# Patient Record
Sex: Female | Born: 1940
Health system: Southern US, Community
[De-identification: ages and names within clinical notes are randomized; demographics above are authoritative.]

## PROBLEM LIST (undated history)

## (undated) DIAGNOSIS — F419 Anxiety disorder, unspecified: Secondary | ICD-10-CM

## (undated) DIAGNOSIS — I10 Essential (primary) hypertension: Secondary | ICD-10-CM

## (undated) DIAGNOSIS — F0390 Unspecified dementia without behavioral disturbance: Secondary | ICD-10-CM

## (undated) DIAGNOSIS — S12090A Other displaced fracture of first cervical vertebra, initial encounter for closed fracture: Secondary | ICD-10-CM

## (undated) DIAGNOSIS — J449 Chronic obstructive pulmonary disease, unspecified: Secondary | ICD-10-CM

## (undated) HISTORY — DX: Chronic obstructive pulmonary disease, unspecified: J44.9

## (undated) HISTORY — PX: OTHER SURGICAL HISTORY: SHX169

## (undated) HISTORY — DX: Essential (primary) hypertension: I10

## (undated) HISTORY — DX: Unspecified dementia, unspecified severity, without behavioral disturbance, psychotic disturbance, mood disturbance, and anxiety: F03.90

## (undated) HISTORY — PX: WRIST SURGERY: SHX841

## (undated) HISTORY — DX: Other displaced fracture of first cervical vertebra, initial encounter for closed fracture: S12.090A

---

## 2011-09-21 ENCOUNTER — Emergency Department (HOSPITAL_COMMUNITY)
Admission: EM | Admit: 2011-09-21 | Discharge: 2011-09-21 | Disposition: A | Discharge status: Home / Self Care | Payer: Medicare Other | Attending: Emergency Medicine | Admitting: Emergency Medicine

## 2011-09-21 ENCOUNTER — Encounter (HOSPITAL_COMMUNITY): Payer: Self-pay

## 2011-09-21 ENCOUNTER — Emergency Department (HOSPITAL_COMMUNITY): Payer: Medicare Other

## 2011-09-21 DIAGNOSIS — S52502A Unspecified fracture of the lower end of left radius, initial encounter for closed fracture: Secondary | ICD-10-CM

## 2011-09-21 DIAGNOSIS — IMO0002 Reserved for concepts with insufficient information to code with codable children: Secondary | ICD-10-CM | POA: Insufficient documentation

## 2011-09-21 DIAGNOSIS — S52609A Unspecified fracture of lower end of unspecified ulna, initial encounter for closed fracture: Secondary | ICD-10-CM | POA: Insufficient documentation

## 2011-09-21 DIAGNOSIS — M25539 Pain in unspecified wrist: Secondary | ICD-10-CM | POA: Insufficient documentation

## 2011-09-21 DIAGNOSIS — R55 Syncope and collapse: Secondary | ICD-10-CM | POA: Insufficient documentation

## 2011-09-21 DIAGNOSIS — S52509A Unspecified fracture of the lower end of unspecified radius, initial encounter for closed fracture: Secondary | ICD-10-CM | POA: Insufficient documentation

## 2011-09-21 DIAGNOSIS — F411 Generalized anxiety disorder: Secondary | ICD-10-CM | POA: Insufficient documentation

## 2011-09-21 DIAGNOSIS — W1789XA Other fall from one level to another, initial encounter: Secondary | ICD-10-CM | POA: Insufficient documentation

## 2011-09-21 DIAGNOSIS — S52613A Displaced fracture of unspecified ulna styloid process, initial encounter for closed fracture: Secondary | ICD-10-CM

## 2011-09-21 HISTORY — DX: Anxiety disorder, unspecified: F41.9

## 2011-09-21 LAB — TROPONIN I: Troponin I: <0.3 ng/mL (ref <0.30)

## 2011-09-21 LAB — CBC
HCT: 42.1 % (ref 36.0–46.0)
MCHC: 34.9 g/dL (ref 30.0–36.0)
Platelets: 237 10*3/uL (ref 150–400)
RDW: 13.1 % (ref 11.5–15.5)
WBC: 13.4 10*3/uL — ABNORMAL HIGH (ref 4.0–10.5)

## 2011-09-21 LAB — BASIC METABOLIC PANEL
BUN: 15 mg/dL (ref 6–23)
Chloride: 96 mEq/L (ref 96–112)
Creatinine, Ser: 0.58 mg/dL (ref 0.50–1.10)
GFR calc Af Amer: >90 mL/min (ref >90)
GFR calc non Af Amer: >90 mL/min (ref >90)
Potassium: 3.5 mEq/L (ref 3.5–5.1)

## 2011-09-21 IMAGING — CR DG WRIST COMPLETE 3+V*L*
3 series · 3 of 3 positions shown · non-contrast
Comparison: Plain films [DATE]

CLINICAL DATA: Post reduction left wrist

LEFT WRIST - COMPLETE 3+ VIEW

[x wrist pa left]
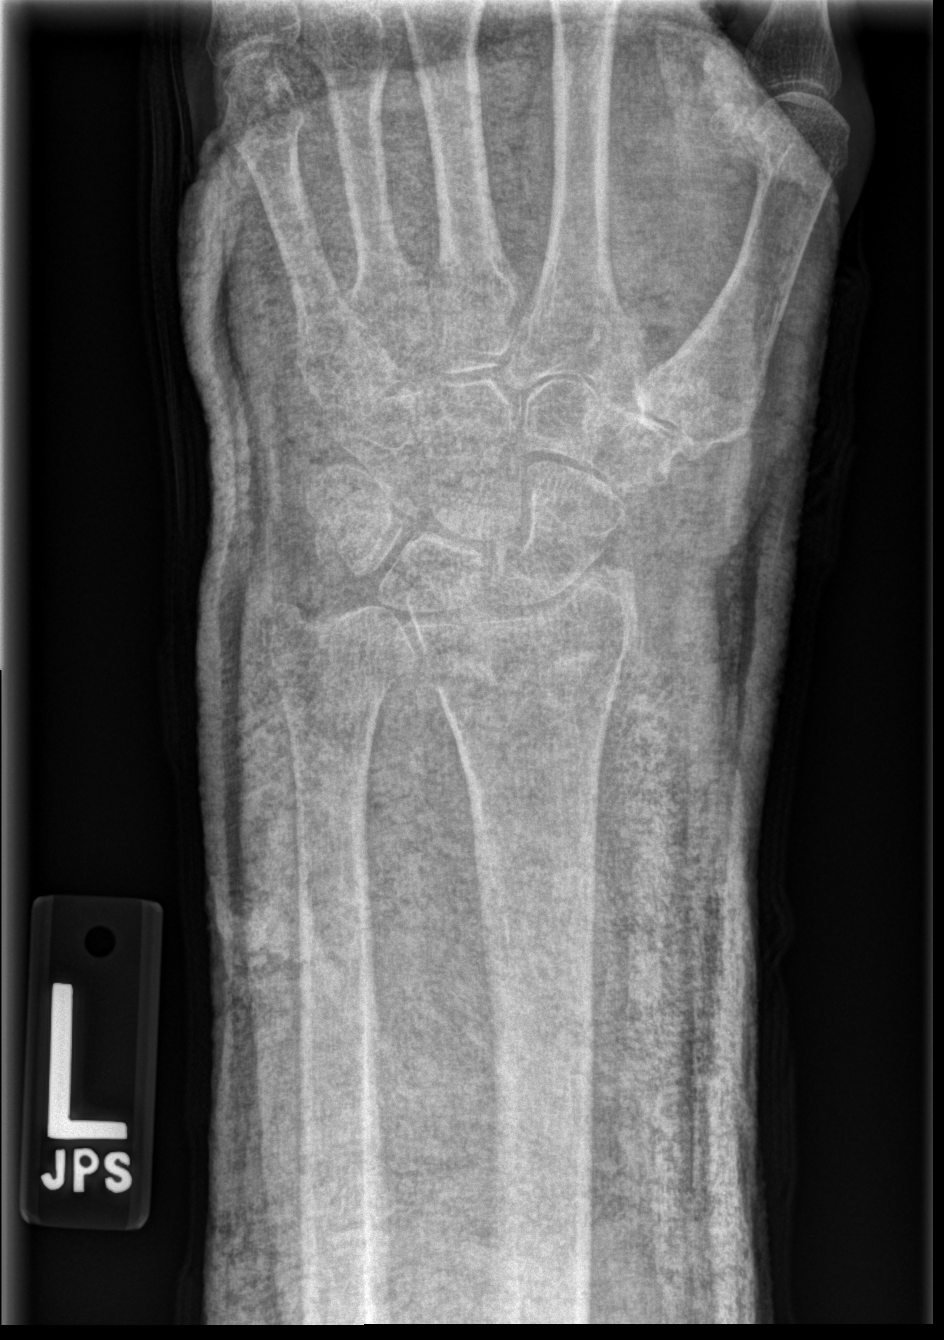

[x wrist obl left]
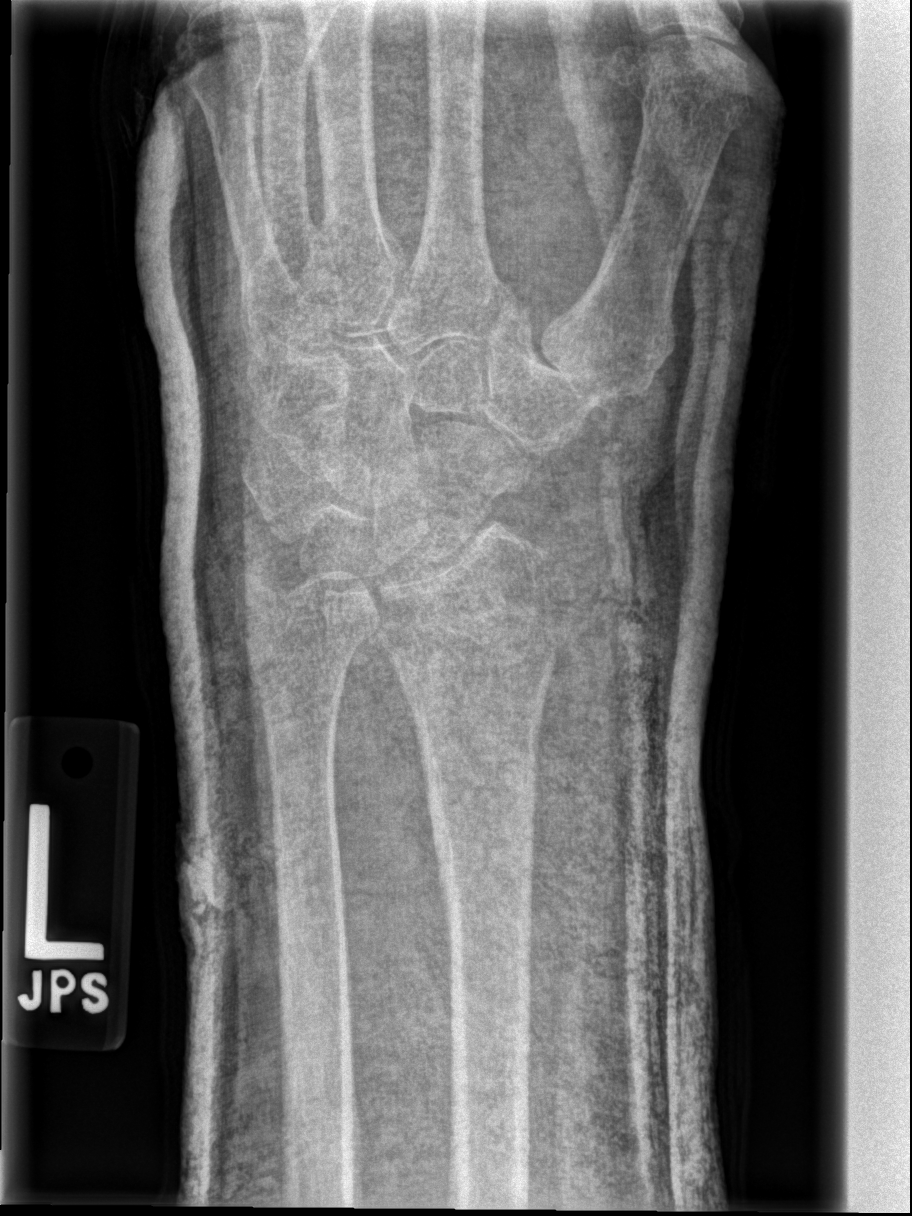

[x wrist lat left]
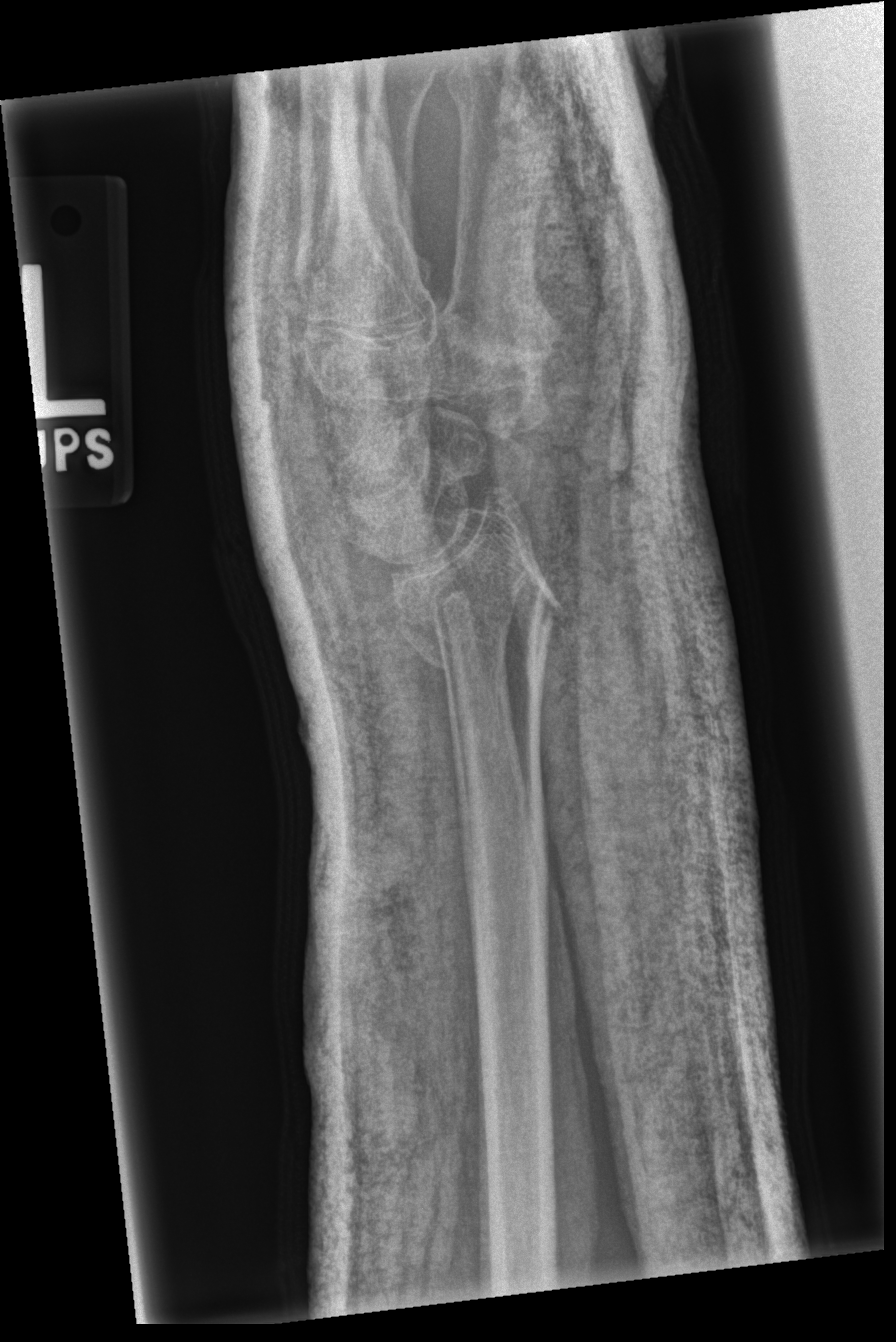

[3 of 3 positions shown; findings below may reference images not displayed]

FINDINGS: Interval reduction of distal left radius and ulnar
fracture.  Radiocarpal joint is intact.  Fine detail is obscured by
the overlying cast material.
IMPRESSION: Post reduction of distal left radius and ulna fracture.

## 2011-09-21 MED ORDER — SODIUM CHLORIDE 0.9 % IV BOLUS (SEPSIS)
500.0000 mL | INTRAVENOUS | Status: AC
  Administered 2011-09-21: 500 mL via INTRAVENOUS

## 2011-09-21 MED ORDER — LIDOCAINE HCL (PF) 1 % IJ SOLN
5.0000 mL | Freq: Once | INTRAMUSCULAR | Status: DC
  Filled 2011-09-21: qty 5

## 2011-09-21 MED ORDER — ONDANSETRON HCL 4 MG/2ML IJ SOLN
4.0000 mg | Freq: Once | INTRAMUSCULAR | Status: AC
  Administered 2011-09-21: 4 mg via INTRAVENOUS
  Filled 2011-09-21: qty 2

## 2011-09-21 MED ORDER — HYDROCODONE-ACETAMINOPHEN 5-325 MG PO TABS: 1.0000 | ORAL_TABLET | ORAL | Status: DC | PRN

## 2011-09-21 NOTE — Progress Notes (Signed)
Orthopedic Tech Progress Note Patient Details:  Amanda Juarez [birth year] [id number]  Ortho Devices Type of Ortho Device: Arm foam sling;Sugartong splint Ortho Device/Splint Location: finger traps Ortho Device/Splint Interventions: Application   Cammer, Mickie Bail 09/21/2011, 12:10 PM

## 2011-09-21 NOTE — ED Provider Notes (Signed)
History     CSN: [id number]  Arrival date & time 09/21/11  8657   First MD Initiated Contact with Patient 09/21/11 [phone]      Chief Complaint  Patient presents with  . Fall    (Consider location/radiation/quality/duration/timing/severity/associated sxs/prior treatment) Patient is a 71 y.o. female presenting with fall. The history is provided by the patient.  Fall The accident occurred 1 to 2 hours ago. The fall occurred while walking. She fell from a height of 3 to 5 ft. She landed on dirt. There was no blood loss. The point of impact was the left wrist. The pain is present in the left wrist. The pain is moderate. She was ambulatory at the scene. There was no entrapment after the fall. There was no alcohol use involved in the accident. Associated symptoms include loss of consciousness (possible, in car after fall while waiting for EMS- having pain and felt hot). Pertinent negatives include no visual change, no fever, no numbness, no abdominal pain, no bowel incontinence, no nausea, no vomiting, no headaches and no tingling. The symptoms are aggravated by use of the injured limb. She has tried immobilization (narcotics) for the symptoms. The treatment provided moderate relief.    Past Medical History  Diagnosis Date  . Anxiety     History reviewed. No pertinent past surgical history.  No family history on file.  History  Substance Use Topics  . Smoking status: Never Smoker   . Smokeless tobacco: Not on file  . Alcohol Use: No    OB History    Grav Para Term Preterm Abortions TAB SAB Ect Mult Living                  Review of Systems  Constitutional: Negative for fever and fatigue.  HENT: Negative for neck pain and neck stiffness.   Eyes: Negative for visual disturbance.  Respiratory: Negative for shortness of breath.   Cardiovascular: Negative for chest pain.  Gastrointestinal: Negative for nausea, vomiting, abdominal pain and bowel incontinence.  Musculoskeletal:  Negative for back pain and gait problem.  Skin: Positive for wound. Negative for pallor and rash.  Neurological: Positive for loss of consciousness (possible, in car after fall while waiting for EMS- having pain and felt hot). Negative for tingling, weakness, light-headedness, numbness and headaches.  Hematological: Does not bruise/bleed easily.  Psychiatric/Behavioral: Negative for confusion.  All other systems reviewed and are negative.    Allergies  Review of patient's allergies indicates no known allergies.  Home Medications  No current outpatient prescriptions on file.  BP 162/90  Pulse 81  Temp 98.2 F (36.8 C) (Oral)  Resp 15  SpO2 98%  Physical Exam  Nursing note and vitals reviewed. Constitutional: She is oriented to person, place, and time. She appears well-developed and well-nourished.  HENT:  Head: Normocephalic and atraumatic.  Eyes: Pupils are equal, round, and reactive to light.  Neck: Full passive range of motion without pain. No spinous process tenderness present.  Cardiovascular: Normal rate, regular rhythm, normal heart sounds and intact distal pulses.   Pulmonary/Chest: Effort normal and breath sounds normal. No respiratory distress. She exhibits no tenderness.  Abdominal: Soft. She exhibits no distension. There is no tenderness.  Musculoskeletal:       Left wrist: She exhibits tenderness and deformity.       Arms: Neurological: She is alert and oriented to person, place, and time. No sensory deficit. GCS eye subscore is 4. GCS verbal subscore is 5. GCS motor subscore is  6.       NVI distal to injury  Skin: Skin is warm and dry. Abrasion noted.     Psychiatric: She has a normal mood and affect.    ED Course  Reduction of fracture Performed by: Theotis Burrow Authorized by: Derwood Kaplan Consent: Verbal consent obtained. Risks and benefits: risks, benefits and alternatives were discussed Consent given by: patient Patient understanding:  patient states understanding of the procedure being performed Patient consent: the patient's understanding of the procedure matches consent given Procedure consent: procedure consent matches procedure scheduled Imaging studies: imaging studies available Required items: required blood products, implants, devices, and special equipment available Patient identity confirmed: verbally with patient Time out: Immediately prior to procedure a "time out" was called to verify the correct patient, procedure, equipment, support staff and site/side marked as required. Preparation: Patient was prepped and draped in the usual sterile fashion. Local anesthesia used: yes Anesthesia: hematoma block Local anesthetic: lidocaine 1% without epinephrine Anesthetic total: 4 ml Patient sedated: no Patient tolerance: Patient tolerated the procedure well with no immediate complications. Comments: Patient placed in finger traps with 10 lbs weight hung from strap across upper arm. Left for ~30 minutes with significant improvement of alignment.   (including critical care time)  Labs Reviewed  CBC - Abnormal; Notable for the following:    WBC 13.4 (*)     All other components within normal limits  BASIC METABOLIC PANEL - Abnormal; Notable for the following:    Sodium 134 (*)     Glucose, Bld 165 (*)     All other components within normal limits  TROPONIN I   Dg Chest 2 View  09/21/2011  *RADIOLOGY REPORT*  Clinical Data: Syncope  CHEST - 2 VIEW  Comparison: None   Findings: Normal cardiac silhouette.  Lungs are hyperinflated.  No effusion, infiltrate, or pneumothorax.  IMPRESSION: No acute cardiopulmonary process.  Original Report Authenticated By: Genevive Bi, M.D.   Dg Wrist Complete Left  09/21/2011  *RADIOLOGY REPORT*  Clinical Data: Post reduction left wrist  LEFT WRIST - COMPLETE 3+ VIEW  Comparison: Plain films 09/21/2011  Findings: Interval reduction of distal left radius and ulnar fracture.   Radiocarpal joint is intact.  Fine detail is obscured by the overlying cast material.  IMPRESSION:  Post reduction of distal left radius and ulna fracture.  Original Report Authenticated By: Genevive Bi, M.D.   Dg Wrist Complete Left  09/21/2011  *RADIOLOGY REPORT*  Clinical Data: Fall, pain.  LEFT WRIST - COMPLETE 3+ VIEW  Comparison: None.  Findings: The patient has a comminuted intra-articular of the distal radius fracture with dorsal angulation and displacement. Ulnar styloid fracture is also identified.  No other fracture is seen.  Soft tissue swelling is present about the wrist. Degenerative change is noted at the base of the thumb.  IMPRESSION: Distal radius and ulnar styloid fractures as above.  Original Report Authenticated By: Bernadene Bell. Maricela Curet, M.D.    Date: 09/21/2011  Rate:80  Rhythm: normal sinus rhythm  QRS Axis: normal  Intervals: normal  ST/T Wave abnormalities: normal  Conduction Disutrbances:none  Narrative Interpretation:   Old EKG Reviewed: none available    1. Closed fracture of left distal radius   2. Fracture Of Ulnar Styloid       MDM  71 year old female presents here today with a mechanical fall. She states that she tripped over the open truck bed, and landed on her outstretched left wrist, and has pain in that area. She states that  her left knee scraped the ground but denies any other injuries. She did not hit her head, and did not pass out. However she states she was sitting in the car with her daughter, when she was having significant pain and felt hot, her daughter told her that she "went out of it" for a brief second, but the patient was not aware of this and says that she remembers everything that happened. She has an obvious deformity to her left wrist, and has no other obvious signs of injuries. X-ray of the wrist does reveal intra-articular fracture. Due to her questionable syncope, we'll check EKG, chest x-ray and basic labs, though if she did then it  was likely a vasovagal response.    Mild leukocytosis, remainder workup for syncope is unremarkable. Significant improvement of alignment of fracture following reduction. Spoke with the on-call orthopedist, Dr. Magnus Ivan, who will have the patient call to try and schedule a followup appointment for this Thursday. Discussed with the patient treatment at home, followup, and indications for return. The patient and her family expressed understanding with this plan.   Theotis Burrow, MD 09/21/11 [phone]

## 2011-09-21 NOTE — ED Notes (Signed)
Pt resting family at bedside.

## 2011-09-21 NOTE — ED Notes (Signed)
Patient was brought in by ambulance S/P fall with deformity noted to the lt wrist. Pt was given a total of 10 mg of Morphine IV PTA. Patient is A/A/Ox4, skin is warm and dry, respiration is even and unlabored.

## 2011-09-22 NOTE — ED Provider Notes (Signed)
I saw and evaluated the patient, reviewed the resident's note and I agree with the findings and plan.  Derwood Kaplan, MD 09/22/11 1906

## 2011-09-23 ENCOUNTER — Other Ambulatory Visit (HOSPITAL_COMMUNITY): Payer: Self-pay | Admitting: Orthopaedic Surgery

## 2011-09-23 ENCOUNTER — Encounter (HOSPITAL_COMMUNITY): Payer: Self-pay | Admitting: *Deleted

## 2011-09-24 ENCOUNTER — Ambulatory Visit (HOSPITAL_COMMUNITY)
Admission: RE | Admit: 2011-09-24 | Discharge: 2011-09-25 | Disposition: A | Discharge status: Home / Self Care | Payer: Medicare Other | Source: Ambulatory Visit | Attending: Orthopaedic Surgery | Admitting: Orthopaedic Surgery

## 2011-09-24 ENCOUNTER — Encounter (HOSPITAL_COMMUNITY): Payer: Self-pay | Admitting: Anesthesiology

## 2011-09-24 ENCOUNTER — Encounter (HOSPITAL_COMMUNITY): Payer: Self-pay | Admitting: *Deleted

## 2011-09-24 ENCOUNTER — Encounter (HOSPITAL_COMMUNITY)
Admission: RE | Disposition: A | Discharge status: Home / Self Care | Payer: Self-pay | Source: Ambulatory Visit | Attending: Orthopaedic Surgery

## 2011-09-24 ENCOUNTER — Ambulatory Visit (HOSPITAL_COMMUNITY): Payer: Medicare Other | Admitting: Anesthesiology

## 2011-09-24 DIAGNOSIS — W19XXXA Unspecified fall, initial encounter: Secondary | ICD-10-CM | POA: Insufficient documentation

## 2011-09-24 DIAGNOSIS — I152 Hypertension secondary to endocrine disorders: Secondary | ICD-10-CM | POA: Diagnosis present

## 2011-09-24 DIAGNOSIS — S52599A Other fractures of lower end of unspecified radius, initial encounter for closed fracture: Secondary | ICD-10-CM | POA: Insufficient documentation

## 2011-09-24 DIAGNOSIS — I10 Essential (primary) hypertension: Secondary | ICD-10-CM | POA: Insufficient documentation

## 2011-09-24 DIAGNOSIS — S52502A Unspecified fracture of the lower end of left radius, initial encounter for closed fracture: Secondary | ICD-10-CM | POA: Diagnosis present

## 2011-09-24 LAB — SURGICAL PCR SCREEN: Staphylococcus aureus: NEGATIVE

## 2011-09-24 SURGERY — OPEN REDUCTION INTERNAL FIXATION (ORIF) DISTAL RADIUS FRACTURE
Anesthesia: General | Site: Wrist | Laterality: Left | Wound class: Clean

## 2011-09-24 MED ORDER — FENTANYL CITRATE 0.05 MG/ML IJ SOLN
INTRAMUSCULAR | Status: DC | PRN
  Administered 2011-09-24 (×2): 25 ug via INTRAVENOUS
  Administered 2011-09-24: 100 ug via INTRAVENOUS

## 2011-09-24 MED ORDER — ONDANSETRON HCL 4 MG PO TABS: 4.0000 mg | ORAL_TABLET | Freq: Four times a day (QID) | ORAL | Status: DC | PRN

## 2011-09-24 MED ORDER — LACTATED RINGERS IV SOLN: INTRAVENOUS | Status: DC

## 2011-09-24 MED ORDER — OXYCODONE HCL 5 MG PO TABS: 5.0000 mg | ORAL_TABLET | ORAL | Status: DC | PRN

## 2011-09-24 MED ORDER — METOCLOPRAMIDE HCL 5 MG/ML IJ SOLN: 5.0000 mg | Freq: Three times a day (TID) | INTRAMUSCULAR | Status: DC | PRN

## 2011-09-24 MED ORDER — METHOCARBAMOL 100 MG/ML IJ SOLN
500.0000 mg | Freq: Four times a day (QID) | INTRAVENOUS | Status: DC | PRN
  Administered 2011-09-24: 500 mg via INTRAVENOUS
  Filled 2011-09-24: qty 5

## 2011-09-24 MED ORDER — FENTANYL CITRATE 0.05 MG/ML IJ SOLN
INTRAMUSCULAR | Status: AC
  Filled 2011-09-24: qty 2

## 2011-09-24 MED ORDER — PROPOFOL 10 MG/ML IV EMUL
INTRAVENOUS | Status: DC | PRN
  Administered 2011-09-24: 120 mg via INTRAVENOUS

## 2011-09-24 MED ORDER — ACETAMINOPHEN 10 MG/ML IV SOLN
INTRAVENOUS | Status: DC | PRN
  Administered 2011-09-24: 1000 mg via INTRAVENOUS

## 2011-09-24 MED ORDER — METHOCARBAMOL 500 MG PO TABS: 500.0000 mg | ORAL_TABLET | Freq: Four times a day (QID) | ORAL | Status: DC | PRN

## 2011-09-24 MED ORDER — MORPHINE SULFATE 2 MG/ML IJ SOLN: 1.0000 mg | INTRAMUSCULAR | Status: DC | PRN

## 2011-09-24 MED ORDER — CEFAZOLIN SODIUM 1-5 GM-% IV SOLN
1.0000 g | Freq: Four times a day (QID) | INTRAVENOUS | Status: DC
Start: 2011-09-24 — End: 2011-09-25
  Administered 2011-09-24 – 2011-09-25 (×2): 1 g via INTRAVENOUS
  Filled 2011-09-24 (×3): qty 50

## 2011-09-24 MED ORDER — PROMETHAZINE HCL 25 MG/ML IJ SOLN: 6.2500 mg | INTRAMUSCULAR | Status: DC | PRN

## 2011-09-24 MED ORDER — DIPHENHYDRAMINE HCL 12.5 MG/5ML PO ELIX: 12.5000 mg | ORAL_SOLUTION | ORAL | Status: DC | PRN

## 2011-09-24 MED ORDER — SODIUM CHLORIDE 0.9 % IR SOLN
Status: DC | PRN
  Administered 2011-09-24: 18:00:00

## 2011-09-24 MED ORDER — METOCLOPRAMIDE HCL 10 MG PO TABS: 5.0000 mg | ORAL_TABLET | Freq: Three times a day (TID) | ORAL | Status: DC | PRN

## 2011-09-24 MED ORDER — CEFAZOLIN SODIUM-DEXTROSE 2-3 GM-% IV SOLR
INTRAVENOUS | Status: AC
  Filled 2011-09-24: qty 50

## 2011-09-24 MED ORDER — LACTATED RINGERS IV SOLN
INTRAVENOUS | Status: DC | PRN
  Administered 2011-09-24 (×2): via INTRAVENOUS

## 2011-09-24 MED ORDER — ZOLPIDEM TARTRATE 5 MG PO TABS: 5.0000 mg | ORAL_TABLET | Freq: Every evening | ORAL | Status: DC | PRN

## 2011-09-24 MED ORDER — CEFAZOLIN SODIUM-DEXTROSE 2-3 GM-% IV SOLR
2.0000 g | INTRAVENOUS | Status: AC
  Administered 2011-09-24: 2 g via INTRAVENOUS

## 2011-09-24 MED ORDER — HYDROCODONE-ACETAMINOPHEN 5-325 MG PO TABS: 1.0000 | ORAL_TABLET | ORAL | Status: AC | PRN

## 2011-09-24 MED ORDER — BUPIVACAINE HCL (PF) 0.25 % IJ SOLN
INTRAMUSCULAR | Status: DC | PRN
  Administered 2011-09-24: 10 mL

## 2011-09-24 MED ORDER — SODIUM CHLORIDE 0.9 % IV SOLN: INTRAVENOUS | Status: DC

## 2011-09-24 MED ORDER — FENTANYL CITRATE 0.05 MG/ML IJ SOLN
25.0000 ug | INTRAMUSCULAR | Status: DC | PRN
  Administered 2011-09-24 (×3): 50 ug via INTRAVENOUS

## 2011-09-24 MED ORDER — MUPIROCIN 2 % EX OINT
TOPICAL_OINTMENT | Freq: Once | CUTANEOUS | Status: AC
  Administered 2011-09-24: 1 via NASAL
  Filled 2011-09-24: qty 22

## 2011-09-24 MED ORDER — ONDANSETRON HCL 4 MG/2ML IJ SOLN: 4.0000 mg | Freq: Four times a day (QID) | INTRAMUSCULAR | Status: DC | PRN

## 2011-09-24 MED ORDER — HYDROCODONE-ACETAMINOPHEN 5-325 MG PO TABS
1.0000 | ORAL_TABLET | ORAL | Status: DC | PRN
  Administered 2011-09-24 (×2): 1 via ORAL
  Administered 2011-09-25 (×3): 2 via ORAL
  Filled 2011-09-24: qty 1
  Filled 2011-09-24 (×2): qty 2
  Filled 2011-09-24: qty 1
  Filled 2011-09-24: qty 2

## 2011-09-24 MED ORDER — ACETAMINOPHEN 10 MG/ML IV SOLN
INTRAVENOUS | Status: AC
  Filled 2011-09-24: qty 100

## 2011-09-24 MED ORDER — BUPIVACAINE HCL (PF) 0.25 % IJ SOLN
INTRAMUSCULAR | Status: AC
  Filled 2011-09-24: qty 30

## 2011-09-24 SURGICAL SUPPLY — 47 items
BAG ZIPLOCK 12X15 (MISCELLANEOUS) IMPLANT
BANDAGE ELASTIC 4 VELCRO ST LF (GAUZE/BANDAGES/DRESSINGS) ×2 IMPLANT
BIT DRILL 2 FAST STEP (BIT) ×2 IMPLANT
BIT DRILL 2.5X4 QC (BIT) ×2 IMPLANT
CLOTH BEACON ORANGE TIMEOUT ST (SAFETY) ×2 IMPLANT
CUFF TOURN SGL QUICK 18 (TOURNIQUET CUFF) ×2 IMPLANT
DRAPE C-ARM 42X72 X-RAY (DRAPES) ×2 IMPLANT
DRAPE U-SHAPE 47X51 STRL (DRAPES) ×2 IMPLANT
DRSG ADAPTIC 3X8 NADH LF (GAUZE/BANDAGES/DRESSINGS) IMPLANT
DRSG PAD ABDOMINAL 8X10 ST (GAUZE/BANDAGES/DRESSINGS) IMPLANT
DURAPREP 26ML APPLICATOR (WOUND CARE) ×2 IMPLANT
ELECT REM PT RETURN 9FT ADLT (ELECTROSURGICAL) ×2
ELECTRODE REM PT RTRN 9FT ADLT (ELECTROSURGICAL) ×1 IMPLANT
GAUZE XEROFORM 1X8 LF (GAUZE/BANDAGES/DRESSINGS) ×2 IMPLANT
GLOVE BIO SURGEON STRL SZ7.5 (GLOVE) ×2 IMPLANT
GOWN STRL REIN XL XLG (GOWN DISPOSABLE) ×2 IMPLANT
K-WIRE 1.6 (WIRE) ×1
K-WIRE FX5X1.6XNS BN SS (WIRE) ×1
KIT BASIN OR (CUSTOM PROCEDURE TRAY) ×2 IMPLANT
KWIRE FX5X1.6XNS BN SS (WIRE) ×1 IMPLANT
NS IRRIG 1000ML POUR BTL (IV SOLUTION) ×2 IMPLANT
PACK LOWER EXTREMITY WL (CUSTOM PROCEDURE TRAY) ×2 IMPLANT
PAD CAST 3X4 CTTN HI CHSV (CAST SUPPLIES) IMPLANT
PAD CAST 4YDX4 CTTN HI CHSV (CAST SUPPLIES) ×1 IMPLANT
PADDING CAST COTTON 3X4 STRL (CAST SUPPLIES)
PADDING CAST COTTON 4X4 STRL (CAST SUPPLIES) ×1
PEG SUBCHONDRAL SMOOTH 2.0X16 (Peg) ×2 IMPLANT
PEG SUBCHONDRAL SMOOTH 2.0X18 (Peg) ×2 IMPLANT
PEG SUBCHONDRAL SMOOTH 2.0X20 (Peg) ×4 IMPLANT
PEG SUBCHONDRAL SMOOTH 2.0X22 (Peg) ×4 IMPLANT
PEG SUBCHONDRAL SMOOTH 2.0X24 (Peg) ×2 IMPLANT
PLATE SHORT 24.4X51.3 LT (Plate) ×2 IMPLANT
POSITIONER SURGICAL ARM (MISCELLANEOUS) ×2 IMPLANT
SCREW BN 12X3.5XNS CORT TI (Screw) ×2 IMPLANT
SCREW CORT 3.5X10 LNG (Screw) ×2 IMPLANT
SCREW CORT 3.5X12 (Screw) ×2 IMPLANT
SPONGE GAUZE 4X4 12PLY (GAUZE/BANDAGES/DRESSINGS) ×2 IMPLANT
STRIP CLOSURE SKIN 1/2X4 (GAUZE/BANDAGES/DRESSINGS) ×2 IMPLANT
SUT MNCRL AB 4-0 PS2 18 (SUTURE) ×2 IMPLANT
SUT VIC AB 0 CT1 27 (SUTURE)
SUT VIC AB 0 CT1 27XBRD ANTBC (SUTURE) IMPLANT
SUT VIC AB 2-0 CT1 27 (SUTURE) ×1
SUT VIC AB 2-0 CT1 TAPERPNT 27 (SUTURE) ×1 IMPLANT
TOWEL OR 17X26 10 PK STRL BLUE (TOWEL DISPOSABLE) ×4 IMPLANT
TOWEL OR NON WOVEN STRL DISP B (DISPOSABLE) ×2 IMPLANT
UNDERPAD 30X30 INCONTINENT (UNDERPADS AND DIAPERS) ×2 IMPLANT
WATER STERILE IRR 1500ML POUR (IV SOLUTION) IMPLANT

## 2011-09-24 NOTE — Progress Notes (Signed)
Patient ID: TISH BEGIN, female   DOB: 09-14-1940, 71 y.o.   MRN: [id number] I called and requested [facility] to see Mrs. Lemire as she is running consistently elevated blood pressures approaching crisis levels. They have kindly agreed to help [place] with her BP control.

## 2011-09-24 NOTE — H&P (Signed)
Amanda Juarez is an 71 y.o. female.   Chief Complaint:   Left displaced distal radius fracture HPI:   Fall on outstretched hand in a 71 yo female.  She sustained a left angulated, shortened and angulated distal radius fracture.  She understands the need for surgery.  She is a highly active individual and understands the risks and benefits involved.  Past Medical History  Diagnosis Date  . Anxiety     Past Surgical History  Procedure Date  . Childbirth [phone]    vaginal deliveries    History reviewed. No pertinent family history. Social History:  reports that she has never smoked. She does not have any smokeless tobacco history on file. She reports that she does not drink alcohol or use illicit drugs.  Allergies: No Known Allergies  Medications Prior to Admission  Medication Sig Dispense Refill  . ibuprofen (ADVIL,MOTRIN) 600 MG tablet Take 500 mg by mouth every 6 (six) hours as needed.        Results for orders placed during the hospital encounter of 09/24/11 (from the past 48 hour(s))  SURGICAL PCR SCREEN     Status: Normal   Collection Time   09/24/11 12:49 PM      Component Value Range Comment   MRSA, PCR NEGATIVE  NEGATIVE    Staphylococcus aureus NEGATIVE  NEGATIVE    No results found.  Review of Systems  All other systems reviewed and are negative.    Blood pressure 194/95, pulse 112, temperature 98.8 F (37.1 C), temperature source Oral, resp. rate 18, height 5\' 3"  (1.6 m), weight 52.39 kg (115 lb 8 oz), SpO2 98.00%. Physical Exam  Constitutional: She is oriented to person, place, and time. She appears well-developed and well-nourished.  HENT:  Head: Normocephalic and atraumatic.  Eyes: EOM are normal. Pupils are equal, round, and reactive to light.  Neck: Normal range of motion. Neck supple.  Cardiovascular: Normal rate and regular rhythm.   Respiratory: Effort normal and breath sounds normal.  GI: Soft. Bowel sounds are normal.    Musculoskeletal:       Left wrist: She exhibits decreased range of motion, tenderness, swelling and deformity.  Neurological: She is alert and oriented to person, place, and time.  Skin: Skin is warm and dry.  Psychiatric: She has a normal mood and affect.     Assessment/Plan Displaced left distal radius fracture 1)  To the OR for surgical fixation of her broken wrist  BLACKMAN,CHRISTOPHER Y 09/24/2011, 4:47 PM

## 2011-09-24 NOTE — Anesthesia Preprocedure Evaluation (Signed)
Anesthesia Evaluation  Patient identified by MRN, date of birth, ID band Patient awake    Reviewed: Allergy & Precautions, H&P , NPO status , Patient's Chart, lab work & pertinent test results  Airway Mallampati: II TM Distance: >3 FB Neck ROM: Full    Dental  (+) Edentulous Upper, Edentulous Lower and Dental Advisory Given   Pulmonary neg pulmonary ROS,  breath sounds clear to auscultation  Pulmonary exam normal       Cardiovascular negative cardio ROS  Rhythm:Regular Rate:Normal     Neuro/Psych Anxiety negative neurological ROS  negative psych ROS   GI/Hepatic negative GI ROS, Neg liver ROS,   Endo/Other  negative endocrine ROS  Renal/GU negative Renal ROS  negative genitourinary   Musculoskeletal negative musculoskeletal ROS (+)   Abdominal   Peds negative pediatric ROS (+)  Hematology negative hematology ROS (+)   Anesthesia Other Findings   Reproductive/Obstetrics negative OB ROS                           Anesthesia Physical Anesthesia Plan  ASA: II  Anesthesia Plan: General   Post-op Pain Management:    Induction: Intravenous  Airway Management Planned: LMA  Additional Equipment:   Intra-op Plan:   Post-operative Plan: Extubation in OR  Informed Consent: I have reviewed the patients History and Physical, chart, labs and discussed the procedure including the risks, benefits and alternatives for the proposed anesthesia with the patient or authorized representative who has indicated his/her understanding and acceptance.   Dental advisory given  Plan Discussed with: CRNA  Anesthesia Plan Comments:         Anesthesia Quick Evaluation

## 2011-09-24 NOTE — Brief Op Note (Signed)
09/24/2011  5:54 PM  PATIENT:  Amanda Juarez  71 y.o. female  PRE-OPERATIVE DIAGNOSIS:  Left intra-articular distal radius fracture  POST-OPERATIVE DIAGNOSIS:  Left intra-articular distal radius fracture  PROCEDURE:  Procedure(s) (LRB): OPEN REDUCTION INTERNAL FIXATION (ORIF) DISTAL RADIAL FRACTURE (Left)  SURGEON:  Surgeon(s) and Role:    * Kathryne Hitch, MD - Primary  PHYSICIAN ASSISTANT:   ASSISTANTS: none   ANESTHESIA:   local and general  EBL:  Total I/O In: 1000 [I.V.:1000] Out: -   BLOOD ADMINISTERED:none  DRAINS: none   LOCAL MEDICATIONS USED:  MARCAINE     SPECIMEN:  No Specimen  DISPOSITION OF SPECIMEN:  N/A  COUNTS:  YES  TOURNIQUET:   Total Tourniquet Time Documented: Upper Arm (Left) - 35 minutes  DICTATION: .Other Dictation: Dictation Number [id number]  PLAN OF CARE: Discharge to home after PACU  PATIENT DISPOSITION:  PACU - hemodynamically stable.   Delay start of Pharmacological VTE agent (>24hrs) due to surgical blood loss or risk of bleeding: not applicable

## 2011-09-24 NOTE — Transfer of Care (Signed)
Immediate Anesthesia Transfer of Care Note  Patient: Amanda Juarez  Procedure(s) Performed: Procedure(s) (LRB): OPEN REDUCTION INTERNAL FIXATION (ORIF) DISTAL RADIAL FRACTURE (Left)  Patient Location: PACU  Anesthesia Type: General  Level of Consciousness: awake, sedated and patient cooperative  Airway & Oxygen Therapy: Patient Spontanous Breathing and Patient connected to face mask oxygen  Post-op Assessment: Report given to PACU RN and Post -op Vital signs reviewed and stable  Post vital signs: Reviewed and stable  Complications: No apparent anesthesia complications

## 2011-09-24 NOTE — Progress Notes (Signed)
Patient informed that Dr Magnus Ivan is still in his previous case. Will keep her updated on her surgical time. She verbalizes understanding.

## 2011-09-25 DIAGNOSIS — I10 Essential (primary) hypertension: Secondary | ICD-10-CM

## 2011-09-25 DIAGNOSIS — I152 Hypertension secondary to endocrine disorders: Secondary | ICD-10-CM | POA: Diagnosis present

## 2011-09-25 DIAGNOSIS — S52599A Other fractures of lower end of unspecified radius, initial encounter for closed fracture: Secondary | ICD-10-CM

## 2011-09-25 MED ORDER — HYDRALAZINE HCL 20 MG/ML IJ SOLN
10.0000 mg | Freq: Four times a day (QID) | INTRAMUSCULAR | Status: DC | PRN
  Administered 2011-09-25: 10 mg via INTRAVENOUS
  Filled 2011-09-25: qty 1

## 2011-09-25 MED ORDER — LISINOPRIL 10 MG PO TABS
10.0000 mg | ORAL_TABLET | Freq: Every day | ORAL | Status: DC
  Administered 2011-09-25: 10 mg via ORAL
  Filled 2011-09-25: qty 1

## 2011-09-25 NOTE — Consult Note (Signed)
[facility] Medical Consultation  Amanda Juarez OZH:[id number] DOB: [birth year] DOA: 09/24/2011 PCP: Amanda Hector, MD   Requesting physician: Dr. Otelia Juarez Date of consultation: 09/25/2011 Reason for consultation: hypertension  Impression/Recommendations Principal Problem:  *Distal radius fracture, left Active Problems:  Hypertension    1. Distal radial fracture status post operative repair. Postoperative care per orthopedics. 2. Hypertension. Patient's elevated blood pressure likely has a component of reaction to pain and anxiety. Her blood pressures have consistently been elevated since admission. She feels that her pain is reasonably controlled at this time and she is comfortable. She does not report any diagnosed history of hypertension, but she has not seen a primary care physician in almost 10 years. Patient will receive hydralazine to help control her blood pressure acutely. We will start the patient on lisinopril since she likely does have an element of hypertension. This will need to be further titrated on the outpatient basis. She will need a repeat basic metabolic panel in 1-2 weeks after starting lisinopril, to ensure her creatinine and potassium are within normal range. This can be followed by her primary care physician  We will followup again tomorrow. Please contact us if we can be of assistance in the meanwhile. Thank you for this consultation.  Chief Complaint:  Elevated blood pressure  HPI:  This is a 71 year old female who was admitted to the hospital after having a mechanical fall and suffered a left distal radius fracture. She underwent operative repair by Dr. Rayburn Juarez. She feels that her pain is relatively well at this time. It was noted the patient was having elevated blood pressures. This was initially thought to be secondary to pain, but her blood pressure remained consistently elevated despite apparent control of pain. Patient also reports a history of  anxiety. She denies any chest pain, shortness of breath, headache or lightheadedness. She denies any other significant complaints. She has not seen a primary care physician in normal 10 years.  Review of Systems:  Pertinent positive as per history of present illness, otherwise negative  Past Medical History  Diagnosis Date  . Anxiety    Past Surgical History  Procedure Date  . Childbirth [phone]    vaginal deliveries   Social History:  reports that she has never smoked. She does not have any smokeless tobacco history on file. She reports that she does not drink alcohol or use illicit drugs.  No Known Allergies History reviewed. No pertinent family history.  Prior to Admission medications   Medication Sig Start Date End Date Taking? Authorizing Provider  ibuprofen (ADVIL,MOTRIN) 600 MG tablet Take 500 mg by mouth every 6 (six) hours as needed.   Yes Historical Provider, MD  HYDROcodone-acetaminophen (NORCO/VICODIN) 5-325 MG per tablet Take 1-2 tablets by mouth every 4 (four) hours as needed for pain. 09/24/11 10/04/11  Amanda Hitch, MD   Physical Exam: Blood pressure 196/94, pulse 88, temperature 98.4 F (36.9 C), temperature source Oral, resp. rate 16, height 5\' 3"  (1.6 m), weight 52.39 kg (115 lb 8 oz), SpO2 100.00%. Filed Vitals:   09/24/11 2058 09/24/11 2200 09/24/11 2300 09/25/11 0149  BP: 188/87 198/93 144/69 196/94  Pulse: 86 79 66 88  Temp: 97.9 F (36.6 C) 98.3 F (36.8 C) 98 F (36.7 C) 98.4 F (36.9 C)  TempSrc: Oral Oral Oral Oral  Resp: 14 16 16 16   Height:      Weight:      SpO2: 98% 100% 99% 100%     General:  No acute  distress, lying comfortably in bed  Eyes:  Pupils are equal round react to light and accommodation  ENT:  No pharyngeal erythema, mucous membranes are moist  Neck: Supple  Cardiovascular:  S1, S2, regular rate and rhythm  Respiratory:  Clear to auscultation bilaterally  Abdomen:  Soft, nontender, nondistended,  bowel sounds are active  Skin:  No visible rashes  Musculoskeletal:  Left forearm is wrapped in Ace bandage and elevated on pillow  Psychiatric:  Normal affect, cooperative with exam  Neurologic:  Grossly intact, nonfocal  Labs on Admission:  Basic Metabolic Panel:  Lab 09/21/11 1610  NA 134*  K 3.5  CL 96  CO2 24  GLUCOSE 165*  BUN 15  CREATININE 0.58  CALCIUM 9.2  MG --  PHOS --   Liver Function Tests: No results found for this basename: AST:5,ALT:5,ALKPHOS:5,BILITOT:5,PROT:5,ALBUMIN:5 in the last 168 hours No results found for this basename: LIPASE:5,AMYLASE:5 in the last 168 hours No results found for this basename: AMMONIA:5 in the last 168 hours CBC:  Lab 09/21/11 1031  WBC 13.4*  NEUTROABS --  HGB 14.7  HCT 42.1  MCV 88.8  PLT 237   Cardiac Enzymes:  Lab 09/21/11 1031  CKTOTAL --  CKMB --  CKMBINDEX --  TROPONINI <0.30   BNP: No components found with this basename: POCBNP:5 CBG: No results found for this basename: GLUCAP:5 in the last 168 hours  Radiological Exams on Admission: No results found.  EKG: Independently reviewed.  Normal EKG  Time spent:  Amanda Juarez [facility] Pager [phone]  If 7PM-7AM, please contact night-coverage [email or web address] Password [facility] 09/25/2011, 2:51 AM

## 2011-09-25 NOTE — Op Note (Signed)
NAMEHILARIE, SINHA NO.:  [id number]  MEDICAL RECORD NO.:  [id number]  LOCATION:  1605                         FACILITY:  [facility]  PHYSICIAN:  Vanita Panda. Magnus Ivan, M.D.DATE OF BIRTH:  1940/12/23  DATE OF PROCEDURE:  09/24/2011 DATE OF DISCHARGE:                              OPERATIVE REPORT   PREOPERATIVE DIAGNOSIS:  Displaced extra-articular distal radius fracture of left wrist.  POSTOPERATIVE DIAGNOSIS:  Displaced extra-articular distal radius fracture of left wrist.  PROCEDURE:  Open reduction and internal fixation of left distal radius fracture.  IMPLANTS:  Hand innovations, distal volar locking plate (PVR).  SURGEON:  Vanita Panda. Magnus Ivan, MD  ANESTHESIA: 1. General 2. Local with 0.25% plain Sensorcaine.  BLOOD LOSS:  Minimal.  TOURNIQUET TIME:  Thirty six minutes.  COMPLICATIONS:  None.  INDICATIONS:  Ms. Portilla is a 71 year old right-hand dominant female, who was incredibly active.  She really does not take any medications, she fell this week on an outstretched left wrist and had a very shortened dorsally angulated and displaced distal radius fracture.  The ER staff attempted to close and reduce this and was able to get her length almost back, but she is still shortened and dorsally angulated significantly.  Recommended due to her high function level in the level of activities, she undergo open reduction and internal fixation of this fracture.  Risks and benefits of this were explained to her in detail and she did wish to proceed with surgery.  PROCEDURE DESCRIPTION:  After informed consent was obtained, appropriate left wrist was marked, she was brought to the operating room, placed supine on the operating table with left arm on radiolucent arm table. General anesthesia was then obtained.  Nonsterile tourniquet was placed around her upper left arm and left arm was prepped and draped from elbow down to the fingers with DuraPrep  sterile drapes.  Time-out was called. She was identified as correct patient, correct left wrist.  I then used an Esmarch to wrap out the wrist and the tourniquet was inflated to 250 mmHg.  I then took a standard volar approach to the wrist and made an incision and dissected down due to the interval between the flexor carpi radialis tendon and the radial artery.  I was able to dissect down to the pronator quadratus and then divided this from radial to ulnar direction to expose the fracture.  I irrigated the fracture hematoma and was able to manipulate the wrist and with traction, reduced her anatomically under direct visualization and fluoroscopy.  I then chose a standard DVR plate and placed this on the volar aspect.  I secured this with proximal and distal screws to secure the fracture in place.  I assessed the wrist under direct fluoroscopy with range of motion and the fracture was reduced anatomically and the screws were in place.  I then copiously irrigated the soft tissues and closed the deep tissue with 0 Vicryl followed by 2-0 Vicryl the subcutaneous tissue, interrupted 3-0 nylon on the skin.  I infiltrated the incisions with 0.25% plain Sensorcaine and well-padded sterile dressing was applied.  The tourniquet was let down.  The fingers pinked nicely.  She was awakened, extubated, and  taken to recovery room in stable condition.  All final counts were correct.  There were no complications noted.     Vanita Panda. Magnus Ivan, M.D.     CYB/MEDQ  D:  09/24/2011  T:  09/25/2011  Job:  [id number]

## 2011-09-25 NOTE — Anesthesia Postprocedure Evaluation (Signed)
Anesthesia Post Note  Patient: Amanda Juarez  Procedure(s) Performed: Procedure(s) (LRB): OPEN REDUCTION INTERNAL FIXATION (ORIF) DISTAL RADIAL FRACTURE (Left)  Anesthesia type: General  Patient location: PACU  Post pain: Pain level controlled  Post assessment: Post-op Vital signs reviewed  Last Vitals:  Filed Vitals:   09/25/11 0149  BP: 196/94  Pulse: 88  Temp: 36.9 C  Resp: 16    Post vital signs: Reviewed  Level of consciousness: sedated  Complications: No apparent anesthesia complications

## 2011-09-25 NOTE — Discharge Summary (Signed)
Patient ID: Amanda Juarez MRN: [id number] DOB/AGE: [birth year] 71 y.o.  Admit date: 09/24/2011 Discharge date: 09/25/2011  Admission Diagnoses:  Principal Problem:  *Distal radius fracture, left Active Problems:  Hypertension   Discharge Diagnoses:  Same  Past Medical History  Diagnosis Date  . Anxiety     Surgeries: Procedure(s): OPEN REDUCTION INTERNAL FIXATION (ORIF) DISTAL RADIAL FRACTURE on 09/24/2011   Consultants: Treatment Team:  Lilyan Gilford, MD  Discharged Condition: Improved  Hospital Course: Amanda Juarez is an 71 y.o. female who was admitted 09/24/2011 for operative treatment ofDistal radius fracture, left. Patient has severe unremitting pain that affects sleep, daily activities, and work/hobbies. After pre-op clearance the patient was taken to the operating room on 09/24/2011 and underwent  Procedure(s): OPEN REDUCTION INTERNAL FIXATION (ORIF) DISTAL RADIAL FRACTURE.    Patient was given perioperative antibiotics: Anti-infectives     Start     Dose/Rate Route Frequency Ordered Stop   09/24/11 2359   ceFAZolin (ANCEF) IVPB 1 g/50 mL premix        1 g 100 mL/hr over 30 Minutes Intravenous Every 6 hours 09/24/11 1954 09/25/11 1759   09/24/11 1744   polymyxin B 500,000 Units, bacitracin 50,000 Units in sodium chloride irrigation 0.9 % 500 mL irrigation  Status:  Discontinued          As needed 09/24/11 1745 09/24/11 1756   09/24/11 1248   ceFAZolin (ANCEF) IVPB 2 g/50 mL premix        2 g 100 mL/hr over 30 Minutes Intravenous 60 min pre-op 09/24/11 1248 09/24/11 1650           Patient was given sequential compression devices, early ambulation, and chemoprophylaxis to prevent DVT.  Patient benefited maximally from hospital stay and there were no complications.    Recent vital signs: Patient Vitals for the past 24 hrs:  BP Temp Temp src Pulse Resp SpO2 Height Weight  09/25/11 1005 185/72 mmHg 97.6 F (36.4 C) Oral 98  16  98 % - -  09/25/11 0418  146/69 mmHg - Oral 82  16  100 % - -  09/25/11 0313 192/89 mmHg - - - - - - -  09/25/11 0149 196/94 mmHg 98.4 F (36.9 C) Oral 88  16  100 % - -  09/24/11 2300 144/69 mmHg 98 F (36.7 C) Oral 66  16  99 % - -  09/24/11 2200 198/93 mmHg 98.3 F (36.8 C) Oral 79  16  100 % - -  09/24/11 2058 188/87 mmHg 97.9 F (36.6 C) Oral 86  14  98 % - -  09/24/11 1930 164/86 mmHg 98.1 F (36.7 C) - 65  14  97 % - -  09/24/11 1915 - 98.8 F (37.1 C) - - - - - -  09/24/11 1907 157/82 mmHg - - - - - - -  09/24/11 1900 171/84 mmHg - - 77  18  97 % - -  09/24/11 1845 185/88 mmHg 98.6 F (37 C) - 83  12  96 % - -  09/24/11 1830 181/84 mmHg - - 80  10  95 % - -  09/24/11 1815 192/67 mmHg - - 88  15  100 % - -  09/24/11 1800 180/89 mmHg 98.1 F (36.7 C) - 82  12  100 % - -  09/24/11 1250 194/95 mmHg 98.8 F (37.1 C) Oral 112  18  98 % 5\' 3"  (1.6 m) 52.39 kg (115 lb 8 oz)  Recent laboratory studies: No results found for this basename: WBC:2,HGB:2,HCT:2,PLT:2,NA:2,K:2,CL:2,CO2:2,BUN:2,CREATININE:2,GLUCOSE:2,PT:2,INR:2,CALCIUM,2: in the last 72 hours   Discharge Medications:   Medication List  As of 09/25/2011 10:42 AM   TAKE these medications         HYDROcodone-acetaminophen 5-325 MG per tablet   Commonly known as: NORCO/VICODIN   Take 1-2 tablets by mouth every 4 (four) hours as needed for pain.      ibuprofen 600 MG tablet   Commonly known as: ADVIL,MOTRIN   Take 500 mg by mouth every 6 (six) hours as needed.            Diagnostic Studies: Dg Chest 2 View  09/21/2011  *RADIOLOGY REPORT*  Clinical Data: Syncope  CHEST - 2 VIEW  Comparison: None   Findings: Normal cardiac silhouette.  Lungs are hyperinflated.  No effusion, infiltrate, or pneumothorax.  IMPRESSION: No acute cardiopulmonary process.  Original Report Authenticated By: Genevive Bi, M.D.   Dg Wrist Complete Left  09/21/2011  *RADIOLOGY REPORT*  Clinical Data: Post reduction left wrist  LEFT WRIST - COMPLETE 3+ VIEW   Comparison: Plain films 09/21/2011  Findings: Interval reduction of distal left radius and ulnar fracture.  Radiocarpal joint is intact.  Fine detail is obscured by the overlying cast material.  IMPRESSION:  Post reduction of distal left radius and ulna fracture.  Original Report Authenticated By: Genevive Bi, M.D.   Dg Wrist Complete Left  09/21/2011  *RADIOLOGY REPORT*  Clinical Data: Fall, pain.  LEFT WRIST - COMPLETE 3+ VIEW  Comparison: None.  Findings: The patient has a comminuted intra-articular of the distal radius fracture with dorsal angulation and displacement. Ulnar styloid fracture is also identified.  No other fracture is seen.  Soft tissue swelling is present about the wrist. Degenerative change is noted at the base of the thumb.  IMPRESSION: Distal radius and ulnar styloid fractures as above.  Original Report Authenticated By: Bernadene Bell. Maricela Curet, M.D.    Disposition: 01-Home or Self Care  Discharge Orders    Future Orders Please Complete By Expires   Diet - low sodium heart healthy      Diet - low sodium heart healthy      Call MD / Call 911      Comments:   If you experience chest pain or shortness of breath, CALL 911 and be transported to the hospital emergency room.  If you develope a fever above 101 F, pus (white drainage) or increased drainage or redness at the wound, or calf pain, call your surgeon's office.   Constipation Prevention      Comments:   Drink plenty of fluids.  Prune juice may be helpful.  You may use a stool softener, such as Colace (over the counter) 100 mg twice a day.  Use MiraLax (over the counter) for constipation as needed.   Increase activity slowly as tolerated      Discharge instructions      Comments:   Leave your current dressing in place for the next 3-4 days. Ice and elevation for swelling. Come in and out of your removable wrist splint as comfort allows. You can get your incision wet starting in 4 days. Dry dressing daily with large  band-aids once you remove your original dressing. Follow-up in 2 weeks at [facility] [phone])   Call MD / Call 911      Comments:   If you experience chest pain or shortness of breath, CALL 911 and be transported to the hospital emergency room.  If you develope a fever above 101 F, pus (white drainage) or increased drainage or redness at the wound, or calf pain, call your surgeon's office.   Constipation Prevention      Comments:   Drink plenty of fluids.  Prune juice may be helpful.  You may use a stool softener, such as Colace (over the counter) 100 mg twice a day.  Use MiraLax (over the counter) for constipation as needed.   Increase activity slowly as tolerated      Discharge instructions      Comments:   Elevation and ice for left wrist and hand swelling.  Expect bruising and swelling. You can remove your dressing in 3-4 days, and then place large band-aids daily. You can get your actual incision wet in the shower in 4-5 days. No heavy lifting with your left hand.   Discharge patient      Discharge patient            Signed: Kathryne Hitch 09/25/2011, 10:42 AM

## 2011-09-25 NOTE — Progress Notes (Signed)
Pt stable, scripts, sling, and d/c instructions given with no questions/concerns voiced by patient or daughter.  Pt transported via wheelchair to private vehicle by NT and daughter.

## 2019-12-12 DIAGNOSIS — Z7409 Other reduced mobility: Secondary | ICD-10-CM | POA: Diagnosis not present

## 2019-12-12 DIAGNOSIS — M47816 Spondylosis without myelopathy or radiculopathy, lumbar region: Secondary | ICD-10-CM | POA: Diagnosis not present

## 2019-12-12 DIAGNOSIS — I10 Essential (primary) hypertension: Secondary | ICD-10-CM | POA: Diagnosis not present

## 2019-12-12 DIAGNOSIS — R2689 Other abnormalities of gait and mobility: Secondary | ICD-10-CM | POA: Diagnosis not present

## 2019-12-12 DIAGNOSIS — S32501A Unspecified fracture of right pubis, initial encounter for closed fracture: Secondary | ICD-10-CM | POA: Diagnosis not present

## 2019-12-12 DIAGNOSIS — S32591A Other specified fracture of right pubis, initial encounter for closed fracture: Secondary | ICD-10-CM | POA: Diagnosis not present

## 2019-12-12 DIAGNOSIS — E871 Hypo-osmolality and hyponatremia: Secondary | ICD-10-CM | POA: Diagnosis not present

## 2019-12-12 DIAGNOSIS — S32511A Fracture of superior rim of right pubis, initial encounter for closed fracture: Secondary | ICD-10-CM | POA: Diagnosis not present

## 2019-12-12 DIAGNOSIS — W208XXA Other cause of strike by thrown, projected or falling object, initial encounter: Secondary | ICD-10-CM | POA: Diagnosis not present

## 2019-12-12 DIAGNOSIS — M1611 Unilateral primary osteoarthritis, right hip: Secondary | ICD-10-CM | POA: Diagnosis not present

## 2019-12-12 DIAGNOSIS — Z9181 History of falling: Secondary | ICD-10-CM | POA: Diagnosis not present

## 2019-12-12 DIAGNOSIS — M25551 Pain in right hip: Secondary | ICD-10-CM | POA: Diagnosis not present

## 2019-12-12 DIAGNOSIS — S32414A Nondisplaced fracture of anterior wall of right acetabulum, initial encounter for closed fracture: Secondary | ICD-10-CM | POA: Diagnosis not present

## 2019-12-12 DIAGNOSIS — S32592A Other specified fracture of left pubis, initial encounter for closed fracture: Secondary | ICD-10-CM | POA: Diagnosis not present

## 2019-12-12 DIAGNOSIS — S59901A Unspecified injury of right elbow, initial encounter: Secondary | ICD-10-CM | POA: Diagnosis not present

## 2019-12-12 DIAGNOSIS — S3210XA Unspecified fracture of sacrum, initial encounter for closed fracture: Secondary | ICD-10-CM | POA: Diagnosis not present

## 2019-12-12 DIAGNOSIS — R262 Difficulty in walking, not elsewhere classified: Secondary | ICD-10-CM | POA: Diagnosis not present

## 2019-12-12 DIAGNOSIS — M25521 Pain in right elbow: Secondary | ICD-10-CM | POA: Diagnosis not present

## 2019-12-12 DIAGNOSIS — Z602 Problems related to living alone: Secondary | ICD-10-CM | POA: Diagnosis not present

## 2019-12-13 DIAGNOSIS — E871 Hypo-osmolality and hyponatremia: Secondary | ICD-10-CM | POA: Diagnosis not present

## 2019-12-13 DIAGNOSIS — M25551 Pain in right hip: Secondary | ICD-10-CM | POA: Diagnosis not present

## 2019-12-13 DIAGNOSIS — W19XXXA Unspecified fall, initial encounter: Secondary | ICD-10-CM | POA: Insufficient documentation

## 2019-12-13 DIAGNOSIS — S32591A Other specified fracture of right pubis, initial encounter for closed fracture: Secondary | ICD-10-CM | POA: Insufficient documentation

## 2019-12-13 DIAGNOSIS — S32511A Fracture of superior rim of right pubis, initial encounter for closed fracture: Secondary | ICD-10-CM | POA: Diagnosis not present

## 2019-12-13 DIAGNOSIS — S32409A Unspecified fracture of unspecified acetabulum, initial encounter for closed fracture: Secondary | ICD-10-CM | POA: Insufficient documentation

## 2019-12-13 DIAGNOSIS — S3210XA Unspecified fracture of sacrum, initial encounter for closed fracture: Secondary | ICD-10-CM | POA: Diagnosis not present

## 2019-12-14 DIAGNOSIS — S32591A Other specified fracture of right pubis, initial encounter for closed fracture: Secondary | ICD-10-CM | POA: Diagnosis not present

## 2019-12-20 DIAGNOSIS — Z791 Long term (current) use of non-steroidal anti-inflammatories (NSAID): Secondary | ICD-10-CM | POA: Diagnosis not present

## 2019-12-20 DIAGNOSIS — W19XXXD Unspecified fall, subsequent encounter: Secondary | ICD-10-CM | POA: Diagnosis not present

## 2019-12-20 DIAGNOSIS — S32501D Unspecified fracture of right pubis, subsequent encounter for fracture with routine healing: Secondary | ICD-10-CM | POA: Diagnosis not present

## 2019-12-20 DIAGNOSIS — Z9181 History of falling: Secondary | ICD-10-CM | POA: Diagnosis not present

## 2019-12-20 DIAGNOSIS — M1611 Unilateral primary osteoarthritis, right hip: Secondary | ICD-10-CM | POA: Diagnosis not present

## 2019-12-20 DIAGNOSIS — S32411D Displaced fracture of anterior wall of right acetabulum, subsequent encounter for fracture with routine healing: Secondary | ICD-10-CM | POA: Diagnosis not present

## 2019-12-20 DIAGNOSIS — E871 Hypo-osmolality and hyponatremia: Secondary | ICD-10-CM | POA: Diagnosis not present

## 2019-12-20 DIAGNOSIS — I10 Essential (primary) hypertension: Secondary | ICD-10-CM | POA: Diagnosis not present

## 2019-12-21 DIAGNOSIS — I10 Essential (primary) hypertension: Secondary | ICD-10-CM | POA: Diagnosis not present

## 2019-12-21 DIAGNOSIS — S32501D Unspecified fracture of right pubis, subsequent encounter for fracture with routine healing: Secondary | ICD-10-CM | POA: Diagnosis not present

## 2019-12-21 DIAGNOSIS — M1611 Unilateral primary osteoarthritis, right hip: Secondary | ICD-10-CM | POA: Diagnosis not present

## 2019-12-21 DIAGNOSIS — Z9181 History of falling: Secondary | ICD-10-CM | POA: Diagnosis not present

## 2019-12-21 DIAGNOSIS — E871 Hypo-osmolality and hyponatremia: Secondary | ICD-10-CM | POA: Diagnosis not present

## 2019-12-21 DIAGNOSIS — S32411D Displaced fracture of anterior wall of right acetabulum, subsequent encounter for fracture with routine healing: Secondary | ICD-10-CM | POA: Diagnosis not present

## 2019-12-21 DIAGNOSIS — Z791 Long term (current) use of non-steroidal anti-inflammatories (NSAID): Secondary | ICD-10-CM | POA: Diagnosis not present

## 2019-12-21 DIAGNOSIS — W19XXXD Unspecified fall, subsequent encounter: Secondary | ICD-10-CM | POA: Diagnosis not present

## 2019-12-24 DIAGNOSIS — W19XXXD Unspecified fall, subsequent encounter: Secondary | ICD-10-CM | POA: Diagnosis not present

## 2019-12-24 DIAGNOSIS — R262 Difficulty in walking, not elsewhere classified: Secondary | ICD-10-CM | POA: Diagnosis not present

## 2019-12-24 DIAGNOSIS — Z791 Long term (current) use of non-steroidal anti-inflammatories (NSAID): Secondary | ICD-10-CM | POA: Diagnosis not present

## 2019-12-24 DIAGNOSIS — Z9181 History of falling: Secondary | ICD-10-CM | POA: Diagnosis not present

## 2019-12-24 DIAGNOSIS — M1611 Unilateral primary osteoarthritis, right hip: Secondary | ICD-10-CM | POA: Diagnosis not present

## 2019-12-24 DIAGNOSIS — I10 Essential (primary) hypertension: Secondary | ICD-10-CM | POA: Diagnosis not present

## 2019-12-24 DIAGNOSIS — E871 Hypo-osmolality and hyponatremia: Secondary | ICD-10-CM | POA: Diagnosis not present

## 2019-12-24 DIAGNOSIS — R2681 Unsteadiness on feet: Secondary | ICD-10-CM | POA: Diagnosis not present

## 2019-12-24 DIAGNOSIS — R2689 Other abnormalities of gait and mobility: Secondary | ICD-10-CM | POA: Diagnosis not present

## 2019-12-24 DIAGNOSIS — S32501D Unspecified fracture of right pubis, subsequent encounter for fracture with routine healing: Secondary | ICD-10-CM | POA: Diagnosis not present

## 2019-12-24 DIAGNOSIS — S32411D Displaced fracture of anterior wall of right acetabulum, subsequent encounter for fracture with routine healing: Secondary | ICD-10-CM | POA: Diagnosis not present

## 2019-12-25 DIAGNOSIS — Z9181 History of falling: Secondary | ICD-10-CM | POA: Diagnosis not present

## 2019-12-25 DIAGNOSIS — W19XXXD Unspecified fall, subsequent encounter: Secondary | ICD-10-CM | POA: Diagnosis not present

## 2019-12-25 DIAGNOSIS — S32501D Unspecified fracture of right pubis, subsequent encounter for fracture with routine healing: Secondary | ICD-10-CM | POA: Diagnosis not present

## 2019-12-25 DIAGNOSIS — S32411D Displaced fracture of anterior wall of right acetabulum, subsequent encounter for fracture with routine healing: Secondary | ICD-10-CM | POA: Diagnosis not present

## 2019-12-25 DIAGNOSIS — I10 Essential (primary) hypertension: Secondary | ICD-10-CM | POA: Diagnosis not present

## 2019-12-25 DIAGNOSIS — E871 Hypo-osmolality and hyponatremia: Secondary | ICD-10-CM | POA: Diagnosis not present

## 2019-12-25 DIAGNOSIS — M1611 Unilateral primary osteoarthritis, right hip: Secondary | ICD-10-CM | POA: Diagnosis not present

## 2019-12-25 DIAGNOSIS — Z791 Long term (current) use of non-steroidal anti-inflammatories (NSAID): Secondary | ICD-10-CM | POA: Diagnosis not present

## 2019-12-26 DIAGNOSIS — Z791 Long term (current) use of non-steroidal anti-inflammatories (NSAID): Secondary | ICD-10-CM | POA: Diagnosis not present

## 2019-12-26 DIAGNOSIS — I10 Essential (primary) hypertension: Secondary | ICD-10-CM | POA: Diagnosis not present

## 2019-12-26 DIAGNOSIS — W19XXXD Unspecified fall, subsequent encounter: Secondary | ICD-10-CM | POA: Diagnosis not present

## 2019-12-26 DIAGNOSIS — S32501D Unspecified fracture of right pubis, subsequent encounter for fracture with routine healing: Secondary | ICD-10-CM | POA: Diagnosis not present

## 2019-12-26 DIAGNOSIS — E871 Hypo-osmolality and hyponatremia: Secondary | ICD-10-CM | POA: Diagnosis not present

## 2019-12-26 DIAGNOSIS — S32411D Displaced fracture of anterior wall of right acetabulum, subsequent encounter for fracture with routine healing: Secondary | ICD-10-CM | POA: Diagnosis not present

## 2019-12-26 DIAGNOSIS — M1611 Unilateral primary osteoarthritis, right hip: Secondary | ICD-10-CM | POA: Diagnosis not present

## 2019-12-26 DIAGNOSIS — Z9181 History of falling: Secondary | ICD-10-CM | POA: Diagnosis not present

## 2019-12-27 DIAGNOSIS — S32591A Other specified fracture of right pubis, initial encounter for closed fracture: Secondary | ICD-10-CM | POA: Diagnosis not present

## 2019-12-27 DIAGNOSIS — I10 Essential (primary) hypertension: Secondary | ICD-10-CM | POA: Diagnosis not present

## 2019-12-27 DIAGNOSIS — S79911D Unspecified injury of right hip, subsequent encounter: Secondary | ICD-10-CM | POA: Diagnosis not present

## 2019-12-27 DIAGNOSIS — S32591D Other specified fracture of right pubis, subsequent encounter for fracture with routine healing: Secondary | ICD-10-CM | POA: Diagnosis not present

## 2019-12-27 DIAGNOSIS — S32401D Unspecified fracture of right acetabulum, subsequent encounter for fracture with routine healing: Secondary | ICD-10-CM | POA: Diagnosis not present

## 2019-12-27 DIAGNOSIS — S32401A Unspecified fracture of right acetabulum, initial encounter for closed fracture: Secondary | ICD-10-CM | POA: Diagnosis not present

## 2019-12-27 DIAGNOSIS — S79912D Unspecified injury of left hip, subsequent encounter: Secondary | ICD-10-CM | POA: Diagnosis not present

## 2019-12-28 DIAGNOSIS — E871 Hypo-osmolality and hyponatremia: Secondary | ICD-10-CM | POA: Diagnosis not present

## 2019-12-28 DIAGNOSIS — Z9181 History of falling: Secondary | ICD-10-CM | POA: Diagnosis not present

## 2019-12-28 DIAGNOSIS — W19XXXD Unspecified fall, subsequent encounter: Secondary | ICD-10-CM | POA: Diagnosis not present

## 2019-12-28 DIAGNOSIS — I10 Essential (primary) hypertension: Secondary | ICD-10-CM | POA: Diagnosis not present

## 2019-12-28 DIAGNOSIS — S32411D Displaced fracture of anterior wall of right acetabulum, subsequent encounter for fracture with routine healing: Secondary | ICD-10-CM | POA: Diagnosis not present

## 2019-12-28 DIAGNOSIS — M1611 Unilateral primary osteoarthritis, right hip: Secondary | ICD-10-CM | POA: Diagnosis not present

## 2019-12-28 DIAGNOSIS — S32501D Unspecified fracture of right pubis, subsequent encounter for fracture with routine healing: Secondary | ICD-10-CM | POA: Diagnosis not present

## 2019-12-28 DIAGNOSIS — Z791 Long term (current) use of non-steroidal anti-inflammatories (NSAID): Secondary | ICD-10-CM | POA: Diagnosis not present

## 2020-01-01 DIAGNOSIS — Z791 Long term (current) use of non-steroidal anti-inflammatories (NSAID): Secondary | ICD-10-CM | POA: Diagnosis not present

## 2020-01-01 DIAGNOSIS — S32411D Displaced fracture of anterior wall of right acetabulum, subsequent encounter for fracture with routine healing: Secondary | ICD-10-CM | POA: Diagnosis not present

## 2020-01-01 DIAGNOSIS — E871 Hypo-osmolality and hyponatremia: Secondary | ICD-10-CM | POA: Diagnosis not present

## 2020-01-01 DIAGNOSIS — S32501D Unspecified fracture of right pubis, subsequent encounter for fracture with routine healing: Secondary | ICD-10-CM | POA: Diagnosis not present

## 2020-01-01 DIAGNOSIS — W19XXXD Unspecified fall, subsequent encounter: Secondary | ICD-10-CM | POA: Diagnosis not present

## 2020-01-01 DIAGNOSIS — I10 Essential (primary) hypertension: Secondary | ICD-10-CM | POA: Diagnosis not present

## 2020-01-01 DIAGNOSIS — Z9181 History of falling: Secondary | ICD-10-CM | POA: Diagnosis not present

## 2020-01-01 DIAGNOSIS — M1611 Unilateral primary osteoarthritis, right hip: Secondary | ICD-10-CM | POA: Diagnosis not present

## 2020-01-03 DIAGNOSIS — Z9181 History of falling: Secondary | ICD-10-CM | POA: Diagnosis not present

## 2020-01-03 DIAGNOSIS — I10 Essential (primary) hypertension: Secondary | ICD-10-CM | POA: Diagnosis not present

## 2020-01-03 DIAGNOSIS — W19XXXD Unspecified fall, subsequent encounter: Secondary | ICD-10-CM | POA: Diagnosis not present

## 2020-01-03 DIAGNOSIS — S32501D Unspecified fracture of right pubis, subsequent encounter for fracture with routine healing: Secondary | ICD-10-CM | POA: Diagnosis not present

## 2020-01-03 DIAGNOSIS — M1611 Unilateral primary osteoarthritis, right hip: Secondary | ICD-10-CM | POA: Diagnosis not present

## 2020-01-03 DIAGNOSIS — E871 Hypo-osmolality and hyponatremia: Secondary | ICD-10-CM | POA: Diagnosis not present

## 2020-01-03 DIAGNOSIS — S32411D Displaced fracture of anterior wall of right acetabulum, subsequent encounter for fracture with routine healing: Secondary | ICD-10-CM | POA: Diagnosis not present

## 2020-01-03 DIAGNOSIS — Z791 Long term (current) use of non-steroidal anti-inflammatories (NSAID): Secondary | ICD-10-CM | POA: Diagnosis not present

## 2020-01-04 ENCOUNTER — Other Ambulatory Visit: Payer: Self-pay

## 2020-01-04 ENCOUNTER — Encounter: Payer: Self-pay | Admitting: Family Medicine

## 2020-01-04 ENCOUNTER — Ambulatory Visit (INDEPENDENT_AMBULATORY_CARE_PROVIDER_SITE_OTHER): Payer: Medicare Other | Admitting: Family Medicine

## 2020-01-04 VITALS — BP 172/77 | HR 103 | Temp 98.4°F | Ht 63.0 in | Wt 115.0 lb

## 2020-01-04 DIAGNOSIS — I10 Essential (primary) hypertension: Secondary | ICD-10-CM

## 2020-01-04 DIAGNOSIS — S32411D Displaced fracture of anterior wall of right acetabulum, subsequent encounter for fracture with routine healing: Secondary | ICD-10-CM | POA: Diagnosis not present

## 2020-01-04 DIAGNOSIS — W19XXXD Unspecified fall, subsequent encounter: Secondary | ICD-10-CM | POA: Diagnosis not present

## 2020-01-04 DIAGNOSIS — M1611 Unilateral primary osteoarthritis, right hip: Secondary | ICD-10-CM | POA: Diagnosis not present

## 2020-01-04 DIAGNOSIS — S32501D Unspecified fracture of right pubis, subsequent encounter for fracture with routine healing: Secondary | ICD-10-CM | POA: Diagnosis not present

## 2020-01-04 DIAGNOSIS — E871 Hypo-osmolality and hyponatremia: Secondary | ICD-10-CM | POA: Diagnosis not present

## 2020-01-04 DIAGNOSIS — Z9181 History of falling: Secondary | ICD-10-CM | POA: Diagnosis not present

## 2020-01-04 DIAGNOSIS — Z791 Long term (current) use of non-steroidal anti-inflammatories (NSAID): Secondary | ICD-10-CM | POA: Diagnosis not present

## 2020-01-04 MED ORDER — AMLODIPINE BESYLATE 5 MG PO TABS: 5.0000 mg | ORAL_TABLET | Freq: Every day | ORAL | 1 refills | Status: DC

## 2020-01-04 NOTE — Patient Instructions (Signed)

## 2020-01-04 NOTE — Progress Notes (Signed)
New Patient Office Visit  Subjective:  Patient ID: Amanda Juarez, female    DOB: 10-14-1940  Age: 79 y.o. MRN: [id number]  CC:  Chief Complaint  Patient presents with   New Patient (Initial Visit)    Dr. Edrick Oh   Establish Care    HPI Amanda Juarez presents to establish care. She is here with her son, Amanda Juarez today. She is currently residing with her son and his wife after a fall last month. Home health is coming in to do OT and PT. She is usually a walker to ambulate. She has not had any other falls.   She denies any concerns today. She did eat before the visit. She had scrambled eggs and bologna for breakfast.   1. HTN She does check her BP at home some time. She reports that they are usually 120/80s. Her son reports that they are elevated at home, and not much lower than what it is today in the office. She denies chest pain, shortness of breath, fatigue, blurry vision, edema, facial asymmetry, lightheadedness, or one sided weakness. She is taking amlopdine 2.5 mg. She did not take her medication today before the visit. Her family helps to make sure she takes her medication daily.    Past Medical History:  Diagnosis Date   Anxiety    Dementia ([place])    Hypertension     Past Surgical History:  Procedure Laterality Date   childbirth  1959,1960,1965,1972   vaginal deliveries   WRIST SURGERY Left     Family History  Problem Relation Age of Onset   Heart disease Mother    Hypertension Mother    Hernia Father    Hypertension Brother     Social History   Socioeconomic History   Marital status: Widowed    Spouse name: Not on file   Number of children: Not on file   Years of education: Not on file   Highest education level: Not on file  Occupational History   Not on file  Tobacco Use   Smoking status: Never Smoker   Smokeless tobacco: Never Used  Vaping Use   Vaping Use: Never used  Substance and Sexual Activity   Alcohol use: No   Drug  use: No   Sexual activity: Not Currently  Other Topics Concern   Not on file  Social History Narrative   Not on file   Social Determinants of Health   Financial Resource Strain:    Difficulty of Paying Living Expenses: Not on file  Food Insecurity:    Worried About [place] in the Last Year: Not on file   Ran Out of Food in the Last Year: Not on file  Transportation Needs:    Lack of Transportation (Medical): Not on file   Lack of Transportation (Non-Medical): Not on file  Physical Activity:    Days of Exercise per Week: Not on file   Minutes of Exercise per Session: Not on file  Stress:    Feeling of Stress : Not on file  Social Connections:    Frequency of Communication with Friends and Family: Not on file   Frequency of Social Gatherings with Friends and Family: Not on file   Attends Religious Services: Not on file   Active Member of Clubs or Organizations: Not on file   Attends Archivist Meetings: Not on file   Marital Status: Not on file  Intimate Partner Violence:    Fear of Current or Ex-Partner:  Not on file   Emotionally Abused: Not on file   Physically Abused: Not on file   Sexually Abused: Not on file    Review of Systems  Per HPI.  Objective:   Today's Vitals: BP (!) 172/77    Pulse (!) 103    Temp 98.4 F (36.9 C) (Temporal)    Ht '5\' 3"'  (1.6 m)    Wt 115 lb (52.2 kg)    SpO2 96%    BMI 20.37 kg/m   Physical Exam Vitals and nursing note reviewed.  Constitutional:      General: She is not in acute distress.    Appearance: Normal appearance. She is not ill-appearing, toxic-appearing or diaphoretic.  HENT:     Head: Normocephalic and atraumatic.     Right Ear: Tympanic membrane, ear canal and external ear normal.     Left Ear: Tympanic membrane, ear canal and external ear normal.     Nose: Nose normal.     Mouth/Throat:     Mouth: Mucous membranes are moist.     Pharynx: Oropharynx is clear.  Eyes:      Extraocular Movements: Extraocular movements intact.     Pupils: Pupils are equal, round, and reactive to light.  Cardiovascular:     Rate and Rhythm: Normal rate and regular rhythm.     Heart sounds: Normal heart sounds. No murmur heard.   Pulmonary:     Effort: Pulmonary effort is normal. No respiratory distress.     Breath sounds: Normal breath sounds.  Abdominal:     General: Bowel sounds are normal. There is no distension.     Palpations: Abdomen is soft.     Tenderness: There is no abdominal tenderness. There is no guarding.  Musculoskeletal:     Cervical back: Neck supple. No tenderness.     Right lower leg: No edema.     Left lower leg: No edema.  Skin:    General: Skin is warm and dry.  Neurological:     Mental Status: She is alert and oriented to person, place, and time. Mental status is at baseline.     Motor: No weakness.     Gait: Gait abnormal (uses walker).    Assessment & Plan:  Amanda Juarez was seen today for new patient (initial visit) and establish care.  Diagnoses and all orders for this visit:  Primary hypertension Increase amlodipine to 5 mg as BP. BP log given. She plans to return next week for her labs and will drop off her BP log. Discussed with son and cut back to 2.14m of amlodipine if BP stays <120/80 or she experiences dizziness or lightheadedness.  -     amLODipine (NORVASC) 5 MG tablet; Take 1 tablet (5 mg total) by mouth daily. -     CMP14+EGFR; Future -     CBC with Differential/Platelet; Future -     Lipid panel; Future -     TSH; Future  Follow-up: Follow up in office in 3 months for chronic follow up, sooner if needed.   The patient indicates understanding of these issues and agrees with the plan.  TGwenlyn Perking FNP

## 2020-01-07 ENCOUNTER — Other Ambulatory Visit: Payer: Self-pay

## 2020-01-07 ENCOUNTER — Ambulatory Visit (INDEPENDENT_AMBULATORY_CARE_PROVIDER_SITE_OTHER): Payer: Medicare Other

## 2020-01-07 DIAGNOSIS — M1611 Unilateral primary osteoarthritis, right hip: Secondary | ICD-10-CM

## 2020-01-07 DIAGNOSIS — S32411D Displaced fracture of anterior wall of right acetabulum, subsequent encounter for fracture with routine healing: Secondary | ICD-10-CM | POA: Diagnosis not present

## 2020-01-07 DIAGNOSIS — E871 Hypo-osmolality and hyponatremia: Secondary | ICD-10-CM

## 2020-01-07 DIAGNOSIS — Z791 Long term (current) use of non-steroidal anti-inflammatories (NSAID): Secondary | ICD-10-CM | POA: Diagnosis not present

## 2020-01-07 DIAGNOSIS — S32501D Unspecified fracture of right pubis, subsequent encounter for fracture with routine healing: Secondary | ICD-10-CM | POA: Diagnosis not present

## 2020-01-07 DIAGNOSIS — W19XXXD Unspecified fall, subsequent encounter: Secondary | ICD-10-CM | POA: Diagnosis not present

## 2020-01-07 DIAGNOSIS — I10 Essential (primary) hypertension: Secondary | ICD-10-CM | POA: Diagnosis not present

## 2020-01-07 DIAGNOSIS — Z9181 History of falling: Secondary | ICD-10-CM

## 2020-01-08 ENCOUNTER — Telehealth: Payer: Self-pay

## 2020-01-08 DIAGNOSIS — S32501D Unspecified fracture of right pubis, subsequent encounter for fracture with routine healing: Secondary | ICD-10-CM | POA: Diagnosis not present

## 2020-01-08 DIAGNOSIS — Z9181 History of falling: Secondary | ICD-10-CM | POA: Diagnosis not present

## 2020-01-08 DIAGNOSIS — I10 Essential (primary) hypertension: Secondary | ICD-10-CM | POA: Diagnosis not present

## 2020-01-08 DIAGNOSIS — S32411D Displaced fracture of anterior wall of right acetabulum, subsequent encounter for fracture with routine healing: Secondary | ICD-10-CM | POA: Diagnosis not present

## 2020-01-08 DIAGNOSIS — Z791 Long term (current) use of non-steroidal anti-inflammatories (NSAID): Secondary | ICD-10-CM | POA: Diagnosis not present

## 2020-01-08 DIAGNOSIS — E871 Hypo-osmolality and hyponatremia: Secondary | ICD-10-CM | POA: Diagnosis not present

## 2020-01-08 DIAGNOSIS — M1611 Unilateral primary osteoarthritis, right hip: Secondary | ICD-10-CM | POA: Diagnosis not present

## 2020-01-08 DIAGNOSIS — W19XXXD Unspecified fall, subsequent encounter: Secondary | ICD-10-CM | POA: Diagnosis not present

## 2020-01-08 NOTE — Telephone Encounter (Signed)
Para March from [facility] health calling for verbal orders for PT for twice a week for 4 weeks and once a week for 3 weeks. Please call back

## 2020-01-09 ENCOUNTER — Other Ambulatory Visit: Payer: Self-pay | Admitting: Family Medicine

## 2020-01-09 ENCOUNTER — Ambulatory Visit (INDEPENDENT_AMBULATORY_CARE_PROVIDER_SITE_OTHER): Payer: Medicare Other

## 2020-01-09 ENCOUNTER — Other Ambulatory Visit: Payer: Medicare Other

## 2020-01-09 ENCOUNTER — Other Ambulatory Visit: Payer: Self-pay

## 2020-01-09 DIAGNOSIS — Z780 Asymptomatic menopausal state: Secondary | ICD-10-CM

## 2020-01-09 DIAGNOSIS — W19XXXA Unspecified fall, initial encounter: Secondary | ICD-10-CM

## 2020-01-09 DIAGNOSIS — I10 Essential (primary) hypertension: Secondary | ICD-10-CM | POA: Diagnosis not present

## 2020-01-09 NOTE — Telephone Encounter (Signed)
I placed an order. Ok to give verbal order as requested.

## 2020-01-09 NOTE — Telephone Encounter (Signed)
Verbal order given to [facility] with [facility]

## 2020-01-10 ENCOUNTER — Encounter: Payer: Self-pay | Admitting: Family Medicine

## 2020-01-10 DIAGNOSIS — S32501D Unspecified fracture of right pubis, subsequent encounter for fracture with routine healing: Secondary | ICD-10-CM | POA: Diagnosis not present

## 2020-01-10 DIAGNOSIS — Z9181 History of falling: Secondary | ICD-10-CM | POA: Diagnosis not present

## 2020-01-10 DIAGNOSIS — W19XXXD Unspecified fall, subsequent encounter: Secondary | ICD-10-CM | POA: Diagnosis not present

## 2020-01-10 DIAGNOSIS — E871 Hypo-osmolality and hyponatremia: Secondary | ICD-10-CM | POA: Diagnosis not present

## 2020-01-10 DIAGNOSIS — Z791 Long term (current) use of non-steroidal anti-inflammatories (NSAID): Secondary | ICD-10-CM | POA: Diagnosis not present

## 2020-01-10 DIAGNOSIS — I10 Essential (primary) hypertension: Secondary | ICD-10-CM | POA: Diagnosis not present

## 2020-01-10 DIAGNOSIS — M810 Age-related osteoporosis without current pathological fracture: Secondary | ICD-10-CM | POA: Diagnosis not present

## 2020-01-10 DIAGNOSIS — S32411D Displaced fracture of anterior wall of right acetabulum, subsequent encounter for fracture with routine healing: Secondary | ICD-10-CM | POA: Diagnosis not present

## 2020-01-10 DIAGNOSIS — M1611 Unilateral primary osteoarthritis, right hip: Secondary | ICD-10-CM | POA: Diagnosis not present

## 2020-01-10 LAB — CBC WITH DIFFERENTIAL/PLATELET
Basophils Absolute: 0.1 10*3/uL (ref 0.0–0.2)
Basos: 1 %
EOS (ABSOLUTE): 0.1 10*3/uL (ref 0.0–0.4)
Eos: 1 %
Hematocrit: 35.4 % (ref 34.0–46.6)
Hemoglobin: 11.9 g/dL (ref 11.1–15.9)
Immature Grans (Abs): 0 10*3/uL (ref 0.0–0.1)
Immature Granulocytes: 0 %
Lymphocytes Absolute: 2.3 10*3/uL (ref 0.7–3.1)
Lymphs: 25 %
MCH: 30.4 pg (ref 26.6–33.0)
MCHC: 33.6 g/dL (ref 31.5–35.7)
MCV: 91 fL (ref 79–97)
Monocytes Absolute: 0.8 10*3/uL (ref 0.1–0.9)
Monocytes: 8 %
Neutrophils Absolute: 6.2 10*3/uL (ref 1.4–7.0)
Neutrophils: 65 %
Platelets: 375 10*3/uL (ref 150–450)
RBC: 3.91 x10E6/uL (ref 3.77–5.28)
RDW: 13.2 % (ref 11.7–15.4)
WBC: 9.4 10*3/uL (ref 3.4–10.8)

## 2020-01-10 LAB — CMP14+EGFR
ALT: 30 IU/L (ref 0–32)
AST: 15 IU/L (ref 0–40)
Albumin/Globulin Ratio: 1.8 (ref 1.2–2.2)
Albumin: 4.2 g/dL (ref 3.7–4.7)
Alkaline Phosphatase: 245 IU/L — ABNORMAL HIGH (ref 44–121)
BUN/Creatinine Ratio: 19 (ref 12–28)
BUN: 11 mg/dL (ref 8–27)
Bilirubin Total: 0.5 mg/dL (ref 0.0–1.2)
CO2: 26 mmol/L (ref 20–29)
Calcium: 9.5 mg/dL (ref 8.7–10.3)
Chloride: 93 mmol/L — ABNORMAL LOW (ref 96–106)
Creatinine, Ser: 0.58 mg/dL (ref 0.57–1.00)
GFR calc Af Amer: 101 mL/min/{1.73_m2} (ref >59)
GFR calc non Af Amer: 87 mL/min/{1.73_m2} (ref >59)
Globulin, Total: 2.4 g/dL (ref 1.5–4.5)
Glucose: 104 mg/dL — ABNORMAL HIGH (ref 65–99)
Potassium: 4 mmol/L (ref 3.5–5.2)
Sodium: 133 mmol/L — ABNORMAL LOW (ref 134–144)
Total Protein: 6.6 g/dL (ref 6.0–8.5)

## 2020-01-10 LAB — LIPID PANEL
Chol/HDL Ratio: 2.5 ratio (ref 0.0–4.4)
Cholesterol, Total: 200 mg/dL — ABNORMAL HIGH (ref 100–199)
HDL: 79 mg/dL (ref >39)
LDL Chol Calc (NIH): 107 mg/dL — ABNORMAL HIGH (ref 0–99)
Triglycerides: 80 mg/dL (ref 0–149)
VLDL Cholesterol Cal: 14 mg/dL (ref 5–40)

## 2020-01-10 LAB — TSH: TSH: 3 u[IU]/mL (ref 0.450–4.500)

## 2020-01-11 ENCOUNTER — Other Ambulatory Visit: Payer: Self-pay | Admitting: Family Medicine

## 2020-01-11 DIAGNOSIS — S32411D Displaced fracture of anterior wall of right acetabulum, subsequent encounter for fracture with routine healing: Secondary | ICD-10-CM | POA: Diagnosis not present

## 2020-01-11 DIAGNOSIS — Z9181 History of falling: Secondary | ICD-10-CM | POA: Diagnosis not present

## 2020-01-11 DIAGNOSIS — W19XXXD Unspecified fall, subsequent encounter: Secondary | ICD-10-CM | POA: Diagnosis not present

## 2020-01-11 DIAGNOSIS — S32501D Unspecified fracture of right pubis, subsequent encounter for fracture with routine healing: Secondary | ICD-10-CM | POA: Diagnosis not present

## 2020-01-11 DIAGNOSIS — Z791 Long term (current) use of non-steroidal anti-inflammatories (NSAID): Secondary | ICD-10-CM | POA: Diagnosis not present

## 2020-01-11 DIAGNOSIS — I10 Essential (primary) hypertension: Secondary | ICD-10-CM | POA: Diagnosis not present

## 2020-01-11 DIAGNOSIS — M810 Age-related osteoporosis without current pathological fracture: Secondary | ICD-10-CM

## 2020-01-11 DIAGNOSIS — E871 Hypo-osmolality and hyponatremia: Secondary | ICD-10-CM | POA: Diagnosis not present

## 2020-01-11 DIAGNOSIS — M1611 Unilateral primary osteoarthritis, right hip: Secondary | ICD-10-CM | POA: Diagnosis not present

## 2020-01-11 MED ORDER — ALENDRONATE SODIUM 70 MG PO TABS: 70.0000 mg | ORAL_TABLET | ORAL | 11 refills | Status: DC

## 2020-01-11 MED ORDER — ERGOCALCIFEROL 10 MCG (400 UNIT) PO TABS: 2.0000 | ORAL_TABLET | Freq: Every day | ORAL | 3 refills | Status: DC

## 2020-01-11 MED ORDER — CALCIUM CARBONATE 600 MG PO TABS: 1200.0000 mg | ORAL_TABLET | Freq: Every day | ORAL | 3 refills | Status: DC

## 2020-01-11 NOTE — Progress Notes (Signed)
lmtcb

## 2020-01-14 DIAGNOSIS — S32411D Displaced fracture of anterior wall of right acetabulum, subsequent encounter for fracture with routine healing: Secondary | ICD-10-CM | POA: Diagnosis not present

## 2020-01-14 DIAGNOSIS — S32501D Unspecified fracture of right pubis, subsequent encounter for fracture with routine healing: Secondary | ICD-10-CM | POA: Diagnosis not present

## 2020-01-14 DIAGNOSIS — E871 Hypo-osmolality and hyponatremia: Secondary | ICD-10-CM | POA: Diagnosis not present

## 2020-01-14 DIAGNOSIS — M1611 Unilateral primary osteoarthritis, right hip: Secondary | ICD-10-CM | POA: Diagnosis not present

## 2020-01-14 DIAGNOSIS — I10 Essential (primary) hypertension: Secondary | ICD-10-CM | POA: Diagnosis not present

## 2020-01-14 DIAGNOSIS — Z791 Long term (current) use of non-steroidal anti-inflammatories (NSAID): Secondary | ICD-10-CM | POA: Diagnosis not present

## 2020-01-14 DIAGNOSIS — W19XXXD Unspecified fall, subsequent encounter: Secondary | ICD-10-CM | POA: Diagnosis not present

## 2020-01-14 DIAGNOSIS — Z9181 History of falling: Secondary | ICD-10-CM | POA: Diagnosis not present

## 2020-01-15 ENCOUNTER — Telehealth: Payer: Self-pay

## 2020-01-15 DIAGNOSIS — Z9181 History of falling: Secondary | ICD-10-CM | POA: Diagnosis not present

## 2020-01-15 DIAGNOSIS — I10 Essential (primary) hypertension: Secondary | ICD-10-CM | POA: Diagnosis not present

## 2020-01-15 DIAGNOSIS — M1611 Unilateral primary osteoarthritis, right hip: Secondary | ICD-10-CM | POA: Diagnosis not present

## 2020-01-15 DIAGNOSIS — E871 Hypo-osmolality and hyponatremia: Secondary | ICD-10-CM | POA: Diagnosis not present

## 2020-01-15 DIAGNOSIS — Z791 Long term (current) use of non-steroidal anti-inflammatories (NSAID): Secondary | ICD-10-CM | POA: Diagnosis not present

## 2020-01-15 DIAGNOSIS — S32411D Displaced fracture of anterior wall of right acetabulum, subsequent encounter for fracture with routine healing: Secondary | ICD-10-CM | POA: Diagnosis not present

## 2020-01-15 DIAGNOSIS — W19XXXD Unspecified fall, subsequent encounter: Secondary | ICD-10-CM | POA: Diagnosis not present

## 2020-01-15 DIAGNOSIS — S32501D Unspecified fracture of right pubis, subsequent encounter for fracture with routine healing: Secondary | ICD-10-CM | POA: Diagnosis not present

## 2020-01-15 NOTE — Telephone Encounter (Signed)
Spoke with son, appointment scheduled on 01/22/2020 with Jannifer Rodney.

## 2020-01-16 DIAGNOSIS — S32411D Displaced fracture of anterior wall of right acetabulum, subsequent encounter for fracture with routine healing: Secondary | ICD-10-CM | POA: Diagnosis not present

## 2020-01-16 DIAGNOSIS — W19XXXD Unspecified fall, subsequent encounter: Secondary | ICD-10-CM | POA: Diagnosis not present

## 2020-01-16 DIAGNOSIS — S32501D Unspecified fracture of right pubis, subsequent encounter for fracture with routine healing: Secondary | ICD-10-CM | POA: Diagnosis not present

## 2020-01-16 DIAGNOSIS — Z9181 History of falling: Secondary | ICD-10-CM | POA: Diagnosis not present

## 2020-01-16 DIAGNOSIS — M1611 Unilateral primary osteoarthritis, right hip: Secondary | ICD-10-CM | POA: Diagnosis not present

## 2020-01-16 DIAGNOSIS — E871 Hypo-osmolality and hyponatremia: Secondary | ICD-10-CM | POA: Diagnosis not present

## 2020-01-16 DIAGNOSIS — Z791 Long term (current) use of non-steroidal anti-inflammatories (NSAID): Secondary | ICD-10-CM | POA: Diagnosis not present

## 2020-01-16 DIAGNOSIS — I10 Essential (primary) hypertension: Secondary | ICD-10-CM | POA: Diagnosis not present

## 2020-01-21 DIAGNOSIS — Z791 Long term (current) use of non-steroidal anti-inflammatories (NSAID): Secondary | ICD-10-CM | POA: Diagnosis not present

## 2020-01-21 DIAGNOSIS — E871 Hypo-osmolality and hyponatremia: Secondary | ICD-10-CM | POA: Diagnosis not present

## 2020-01-21 DIAGNOSIS — W19XXXD Unspecified fall, subsequent encounter: Secondary | ICD-10-CM | POA: Diagnosis not present

## 2020-01-21 DIAGNOSIS — M1611 Unilateral primary osteoarthritis, right hip: Secondary | ICD-10-CM | POA: Diagnosis not present

## 2020-01-21 DIAGNOSIS — S32501D Unspecified fracture of right pubis, subsequent encounter for fracture with routine healing: Secondary | ICD-10-CM | POA: Diagnosis not present

## 2020-01-21 DIAGNOSIS — S32411D Displaced fracture of anterior wall of right acetabulum, subsequent encounter for fracture with routine healing: Secondary | ICD-10-CM | POA: Diagnosis not present

## 2020-01-21 DIAGNOSIS — Z9181 History of falling: Secondary | ICD-10-CM | POA: Diagnosis not present

## 2020-01-21 DIAGNOSIS — I10 Essential (primary) hypertension: Secondary | ICD-10-CM | POA: Diagnosis not present

## 2020-01-22 ENCOUNTER — Encounter: Payer: Self-pay | Admitting: Family Medicine

## 2020-01-22 ENCOUNTER — Ambulatory Visit: Payer: Medicare Other | Admitting: Family

## 2020-01-23 ENCOUNTER — Telehealth: Payer: Self-pay

## 2020-01-23 DIAGNOSIS — S32411D Displaced fracture of anterior wall of right acetabulum, subsequent encounter for fracture with routine healing: Secondary | ICD-10-CM | POA: Diagnosis not present

## 2020-01-23 DIAGNOSIS — E871 Hypo-osmolality and hyponatremia: Secondary | ICD-10-CM | POA: Diagnosis not present

## 2020-01-23 DIAGNOSIS — I10 Essential (primary) hypertension: Secondary | ICD-10-CM | POA: Diagnosis not present

## 2020-01-23 DIAGNOSIS — Z9181 History of falling: Secondary | ICD-10-CM | POA: Diagnosis not present

## 2020-01-23 DIAGNOSIS — Z791 Long term (current) use of non-steroidal anti-inflammatories (NSAID): Secondary | ICD-10-CM | POA: Diagnosis not present

## 2020-01-23 DIAGNOSIS — M1611 Unilateral primary osteoarthritis, right hip: Secondary | ICD-10-CM | POA: Diagnosis not present

## 2020-01-23 DIAGNOSIS — W19XXXD Unspecified fall, subsequent encounter: Secondary | ICD-10-CM | POA: Diagnosis not present

## 2020-01-23 DIAGNOSIS — S32501D Unspecified fracture of right pubis, subsequent encounter for fracture with routine healing: Secondary | ICD-10-CM | POA: Diagnosis not present

## 2020-01-28 ENCOUNTER — Ambulatory Visit: Payer: Medicare Other | Admitting: Family Medicine
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
## 2020-01-29 NOTE — Telephone Encounter (Signed)
Patient has a follow up appointment with her orthopaedic doctor on 02/15/2020, I advised her she should speak with them at that appointment and let him determine if she is okay to drive again.

## 2020-01-30 DIAGNOSIS — I10 Essential (primary) hypertension: Secondary | ICD-10-CM | POA: Diagnosis not present

## 2020-01-30 DIAGNOSIS — E871 Hypo-osmolality and hyponatremia: Secondary | ICD-10-CM | POA: Diagnosis not present

## 2020-01-30 DIAGNOSIS — Z791 Long term (current) use of non-steroidal anti-inflammatories (NSAID): Secondary | ICD-10-CM | POA: Diagnosis not present

## 2020-01-30 DIAGNOSIS — S32501D Unspecified fracture of right pubis, subsequent encounter for fracture with routine healing: Secondary | ICD-10-CM | POA: Diagnosis not present

## 2020-01-30 DIAGNOSIS — W19XXXD Unspecified fall, subsequent encounter: Secondary | ICD-10-CM | POA: Diagnosis not present

## 2020-01-30 DIAGNOSIS — M1611 Unilateral primary osteoarthritis, right hip: Secondary | ICD-10-CM | POA: Diagnosis not present

## 2020-01-30 DIAGNOSIS — S32411D Displaced fracture of anterior wall of right acetabulum, subsequent encounter for fracture with routine healing: Secondary | ICD-10-CM | POA: Diagnosis not present

## 2020-01-30 DIAGNOSIS — Z9181 History of falling: Secondary | ICD-10-CM | POA: Diagnosis not present

## 2020-02-04 DIAGNOSIS — Z791 Long term (current) use of non-steroidal anti-inflammatories (NSAID): Secondary | ICD-10-CM | POA: Diagnosis not present

## 2020-02-04 DIAGNOSIS — W19XXXD Unspecified fall, subsequent encounter: Secondary | ICD-10-CM | POA: Diagnosis not present

## 2020-02-04 DIAGNOSIS — I10 Essential (primary) hypertension: Secondary | ICD-10-CM | POA: Diagnosis not present

## 2020-02-04 DIAGNOSIS — E871 Hypo-osmolality and hyponatremia: Secondary | ICD-10-CM | POA: Diagnosis not present

## 2020-02-04 DIAGNOSIS — S32501D Unspecified fracture of right pubis, subsequent encounter for fracture with routine healing: Secondary | ICD-10-CM | POA: Diagnosis not present

## 2020-02-04 DIAGNOSIS — Z9181 History of falling: Secondary | ICD-10-CM | POA: Diagnosis not present

## 2020-02-04 DIAGNOSIS — M1611 Unilateral primary osteoarthritis, right hip: Secondary | ICD-10-CM | POA: Diagnosis not present

## 2020-02-04 DIAGNOSIS — S32411D Displaced fracture of anterior wall of right acetabulum, subsequent encounter for fracture with routine healing: Secondary | ICD-10-CM | POA: Diagnosis not present

## 2020-02-15 DIAGNOSIS — S32591D Other specified fracture of right pubis, subsequent encounter for fracture with routine healing: Secondary | ICD-10-CM | POA: Diagnosis not present

## 2020-02-15 DIAGNOSIS — S32409D Unspecified fracture of unspecified acetabulum, subsequent encounter for fracture with routine healing: Secondary | ICD-10-CM | POA: Diagnosis not present

## 2020-02-15 DIAGNOSIS — S329XXD Fracture of unspecified parts of lumbosacral spine and pelvis, subsequent encounter for fracture with routine healing: Secondary | ICD-10-CM | POA: Diagnosis not present

## 2020-02-15 DIAGNOSIS — M47816 Spondylosis without myelopathy or radiculopathy, lumbar region: Secondary | ICD-10-CM | POA: Diagnosis not present

## 2020-02-15 DIAGNOSIS — S79911D Unspecified injury of right hip, subsequent encounter: Secondary | ICD-10-CM | POA: Diagnosis not present

## 2020-04-04 ENCOUNTER — Ambulatory Visit: Payer: Medicare Other | Admitting: Family Medicine

## 2020-04-07 ENCOUNTER — Ambulatory Visit: Payer: Medicare Other | Admitting: Family Medicine

## 2020-04-09 ENCOUNTER — Ambulatory Visit (INDEPENDENT_AMBULATORY_CARE_PROVIDER_SITE_OTHER): Payer: Medicare Other | Admitting: Family Medicine

## 2020-04-09 ENCOUNTER — Other Ambulatory Visit: Payer: Self-pay

## 2020-04-09 ENCOUNTER — Encounter: Payer: Self-pay | Admitting: Family Medicine

## 2020-04-09 VITALS — BP 148/74 | HR 101 | Temp 98.4°F | Ht 63.0 in | Wt 112.0 lb

## 2020-04-09 DIAGNOSIS — M810 Age-related osteoporosis without current pathological fracture: Secondary | ICD-10-CM | POA: Diagnosis not present

## 2020-04-09 DIAGNOSIS — I10 Essential (primary) hypertension: Secondary | ICD-10-CM

## 2020-04-09 NOTE — Progress Notes (Signed)
Established Patient Office Visit  Subjective:  Patient ID: Amanda Juarez, female    DOB: 03-10-1940  Age: 80 y.o. MRN: [id number]  CC:  Chief Complaint  Patient presents with  . Medical Management of Chronic Issues    HPI [name] presents for chronic follow up. She has been doing well and does not have any concerns today.   1. HTN Complaint with meds - Yes Current Medications - amlodipine 5 mg  Checking BP at home: no Exercising Regularly - doing PT Watching Salt intake - Yes Pertinent ROS:  Headache - No Fatigue - No Visual Disturbances - No Chest pain - No Dyspnea - No Palpitations - No LE edema - No They report good compliance with medications and can restate their regimen by memory. No medication side effects.  Family, social, and smoking history reviewed.   BP Readings from Last 3 Encounters:  04/09/20 (!) 148/74  01/04/20 (!) 172/77  09/25/11 (!) 169/74   CMP Latest Ref Rng & Units 01/09/2020 09/21/2011  Glucose 65 - 99 mg/dL 104(H) 165(H)  BUN 8 - 27 mg/dL 11 15  Creatinine 0.57 - 1.00 mg/dL 0.58 0.58  Sodium 134 - 144 mmol/L 133(L) 134(L)  Potassium 3.5 - 5.2 mmol/L 4.0 3.5  Chloride 96 - 106 mmol/L 93(L) 96  CO2 20 - 29 mmol/L 26 24  Calcium 8.7 - 10.3 mg/dL 9.5 9.2  Total Protein 6.0 - 8.5 g/dL 6.6 -  Total Bilirubin 0.0 - 1.2 mg/dL 0.5 -  Alkaline Phos 44 - 121 IU/L 245(H) -  AST 0 - 40 IU/L 15 -  ALT 0 - 32 IU/L 30 -    2. Osteoporosis On fosamax. Taking calcium and vitamin D supplement. Denies falls since last visit.   Past Medical History:  Diagnosis Date  . Anxiety   . Dementia ([place])   . Hypertension     Past Surgical History:  Procedure Laterality Date  . childbirth  [phone]   vaginal deliveries  . WRIST SURGERY Left     Family History  Problem Relation Age of Onset  . Heart disease Mother   . Hypertension Mother   . Hernia Father   . Hypertension Brother     Social History   Socioeconomic History   . Marital status: Widowed    Spouse name: Not on file  . Number of children: Not on file  . Years of education: Not on file  . Highest education level: Not on file  Occupational History  . Not on file  Tobacco Use  . Smoking status: Never Smoker  . Smokeless tobacco: Never Used  Vaping Use  . Vaping Use: Never used  Substance and Sexual Activity  . Alcohol use: No  . Drug use: No  . Sexual activity: Not Currently  Other Topics Concern  . Not on file  Social History Narrative  . Not on file   Social Determinants of Health   Financial Resource Strain: Not on file  Food Insecurity: Not on file  Transportation Needs: Not on file  Physical Activity: Not on file  Stress: Not on file  Social Connections: Not on file  Intimate Partner Violence: Not on file    Outpatient Medications Prior to Visit  Medication Sig Dispense Refill  . alendronate (FOSAMAX) 70 MG tablet Take 1 tablet (70 mg total) by mouth every 7 (seven) days. Take with a full glass of water on an empty stomach. Avoid lying down for 30 minute after taking. 4  tablet 11  . calcium carbonate (OS-CAL) 600 MG TABS tablet Take 2 tablets (1,200 mg total) by mouth daily with breakfast. 180 tablet 3  . Ergocalciferol 10 MCG (400 UNIT) TABS Take 2 tablets by mouth daily. 180 tablet 3  . amLODipine (NORVASC) 5 MG tablet Take 1 tablet (5 mg total) by mouth daily. 90 tablet 1   No facility-administered medications prior to visit.    Allergies  Allergen Reactions  . Titanium Dioxide     ROS Review of Systems Negative unless specially indicated above in HPI.     Objective:    Physical Exam Constitutional:      General: She is not in acute distress.    Appearance: Normal appearance. She is well-developed and well-nourished. She is not ill-appearing, toxic-appearing or diaphoretic.  HENT:     Head: Normocephalic and atraumatic.     Nose: Nose normal.     Mouth/Throat:     Mouth: Oropharynx is clear and moist.   Eyes:     Extraocular Movements: EOM normal.  Neck:     Thyroid: No thyromegaly.     Vascular: No carotid bruit or JVD.     Trachea: Trachea normal.  Cardiovascular:     Rate and Rhythm: Normal rate and regular rhythm.     Pulses: Intact distal pulses.     Heart sounds: Normal heart sounds. No murmur heard. No friction rub. No gallop.   Pulmonary:     Effort: Pulmonary effort is normal.     Breath sounds: Normal breath sounds.  Abdominal:     General: Bowel sounds are normal. There is no distension.     Palpations: Abdomen is soft. There is no mass.     Tenderness: There is no abdominal tenderness.  Musculoskeletal:     Cervical back: Full passive range of motion without pain and neck supple.  Lymphadenopathy:     Cervical: No cervical adenopathy.  Skin:    General: Skin is warm and dry.  Neurological:     General: No focal deficit present.     Mental Status: She is alert and oriented to person, place, and time.     Deep Tendon Reflexes: Reflexes are normal and symmetric.  Psychiatric:        Mood and Affect: Mood and affect and mood normal.        Behavior: Behavior normal.        Thought Content: Thought content normal.        Judgment: Judgment normal.     BP (!) 148/74   Pulse (!) 101   Temp 98.4 F (36.9 C)   Ht '5\' 3"'  (1.6 m)   Wt 112 lb (50.8 kg)   SpO2 97%   BMI 19.84 kg/m  Wt Readings from Last 3 Encounters:  04/09/20 112 lb (50.8 kg)  01/04/20 115 lb (52.2 kg)  09/24/11 115 lb 8 oz (52.4 kg)     There are no preventive care reminders to display for this patient.  There are no preventive care reminders to display for this patient.  Lab Results  Component Value Date   TSH 3.000 01/09/2020   Lab Results  Component Value Date   WBC 9.4 01/09/2020   HGB 11.9 01/09/2020   HCT 35.4 01/09/2020   MCV 91 01/09/2020   PLT 375 01/09/2020   Lab Results  Component Value Date   NA 133 (L) 01/09/2020   K 4.0 01/09/2020   CO2 26 01/09/2020   GLUCOSE  104 (  H) 01/09/2020   BUN 11 01/09/2020   CREATININE 0.58 01/09/2020   BILITOT 0.5 01/09/2020   ALKPHOS 245 (H) 01/09/2020   AST 15 01/09/2020   ALT 30 01/09/2020   PROT 6.6 01/09/2020   ALBUMIN 4.2 01/09/2020   CALCIUM 9.5 01/09/2020   Lab Results  Component Value Date   CHOL 200 (H) 01/09/2020   Lab Results  Component Value Date   HDL 79 01/09/2020   Lab Results  Component Value Date   LDLCALC 107 (H) 01/09/2020   Lab Results  Component Value Date   TRIG 80 01/09/2020   Lab Results  Component Value Date   CHOLHDL 2.5 01/09/2020   No results found for: HGBA1C    Assessment & Plan:   Ki was seen today for medical management of chronic issues.  Diagnoses and all orders for this visit:  Primary hypertension BP 148/74 today. Continue amlodipine 5 mg daily. Labs pending as below.  -     CBC with Differential/Platelet -     CMP14+EGFR  Age related osteoporosis, unspecified pathological fracture presence DEXA UTD. On fosamax, calcium and vit D supplement. No recent falls.    Follow-up: Return in about 3 months (around 07/07/2020) for chronic follow up.    Gwenlyn Perking, FNP

## 2020-04-09 NOTE — Patient Instructions (Signed)

## 2020-05-07 ENCOUNTER — Other Ambulatory Visit: Payer: Self-pay | Admitting: Family Medicine

## 2020-05-07 DIAGNOSIS — I10 Essential (primary) hypertension: Secondary | ICD-10-CM

## 2020-06-06 DIAGNOSIS — H40033 Anatomical narrow angle, bilateral: Secondary | ICD-10-CM | POA: Diagnosis not present

## 2020-06-06 DIAGNOSIS — H2513 Age-related nuclear cataract, bilateral: Secondary | ICD-10-CM | POA: Diagnosis not present

## 2020-06-06 DIAGNOSIS — Z0100 Encounter for examination of eyes and vision without abnormal findings: Secondary | ICD-10-CM | POA: Diagnosis not present

## 2020-06-19 ENCOUNTER — Telehealth: Payer: Self-pay

## 2020-06-19 NOTE — Telephone Encounter (Signed)
Pcp and appointment changed

## 2020-06-19 NOTE — Telephone Encounter (Signed)
That is fine 

## 2020-06-19 NOTE — Telephone Encounter (Signed)
okay

## 2020-07-02 DIAGNOSIS — H04123 Dry eye syndrome of bilateral lacrimal glands: Secondary | ICD-10-CM | POA: Diagnosis not present

## 2020-07-08 ENCOUNTER — Ambulatory Visit: Payer: Medicare Other | Admitting: Family Medicine

## 2020-07-09 ENCOUNTER — Other Ambulatory Visit: Payer: Self-pay

## 2020-07-09 ENCOUNTER — Encounter: Payer: Self-pay | Admitting: Family Medicine

## 2020-07-09 ENCOUNTER — Ambulatory Visit (INDEPENDENT_AMBULATORY_CARE_PROVIDER_SITE_OTHER): Payer: Medicare HMO | Admitting: Family Medicine

## 2020-07-09 VITALS — BP 147/81 | HR 93 | Temp 99.7°F | Ht 63.0 in | Wt 113.4 lb

## 2020-07-09 DIAGNOSIS — R739 Hyperglycemia, unspecified: Secondary | ICD-10-CM | POA: Diagnosis not present

## 2020-07-09 DIAGNOSIS — I10 Essential (primary) hypertension: Secondary | ICD-10-CM | POA: Diagnosis not present

## 2020-07-09 DIAGNOSIS — Z1321 Encounter for screening for nutritional disorder: Secondary | ICD-10-CM

## 2020-07-09 DIAGNOSIS — M810 Age-related osteoporosis without current pathological fracture: Secondary | ICD-10-CM | POA: Diagnosis not present

## 2020-07-09 MED ORDER — AMLODIPINE BESYLATE 2.5 MG PO TABS: 2.5000 mg | ORAL_TABLET | Freq: Every day | ORAL | 1 refills | Status: DC

## 2020-07-09 MED ORDER — DENOSUMAB 60 MG/ML ~~LOC~~ SOSY: 60.0000 mg | PREFILLED_SYRINGE | Freq: Once | SUBCUTANEOUS | 0 refills | Status: AC

## 2020-07-09 NOTE — Progress Notes (Signed)
Subjective:  Patient ID: Amanda Juarez, female    DOB: 1941/02/11  Age: 80 y.o. MRN: [id number]  CC: Establish with new provider   HPI Amanda Juarez presents for Worried that Amanda Juarez will lose Amanda Juarez mind if Amanda Juarez BP goes too low. Amanda Juarez amlodipine causes the DBP  to drop below 70 and into the 50s. Concern is that the DBP will cause dementia.   Amanda Juarez when Amanda Juarez truck tailgate knocked Amanda Juarez over. Breaking hip and pelvis last August. Had subsequent bone density that showed  T scores of -4 range. Tried fosamax, but it makes Amanda Juarez constipated so it was DCed.    Depression screen [facility] 2/9 07/09/2020 07/09/2020 04/09/2020  Decreased Interest 0 0 0  Down, Depressed, Hopeless 0 0 0  PHQ - 2 Score 0 0 0    History Amanda Juarez has a past medical history of Anxiety, Dementia ([place]), and Hypertension.   Amanda Juarez has a past surgical history that includes childbirth [phone]) and Wrist surgery (Left).   Amanda Juarez family history includes Heart disease in Amanda Juarez mother; Hernia in Amanda Juarez father; Hypertension in Amanda Juarez brother and mother.Amanda Juarez reports that Amanda Juarez has never smoked. Amanda Juarez has never used smokeless tobacco. Amanda Juarez reports that Amanda Juarez does not drink alcohol and does not use drugs.    ROS Review of Systems  Constitutional: Negative.   HENT: Negative for congestion.   Eyes: Negative for visual disturbance.  Respiratory: Negative for shortness of breath.   Cardiovascular: Negative for chest pain.  Gastrointestinal: Negative for abdominal pain, constipation, diarrhea, nausea and vomiting.  Genitourinary: Negative for difficulty urinating.  Musculoskeletal: Negative for arthralgias and myalgias.  Neurological: Negative for headaches.  Psychiatric/Behavioral: Negative for sleep disturbance.    Objective:  BP (!) 147/81   Pulse 93   Temp 99.7 F (37.6 C)   Ht _0  (1.6 m)   Wt 113 lb 6.4 oz (51.4 kg)   SpO2 97%   BMI 20.09 kg/m   BP Readings from Last 3 Encounters:  07/09/20 (!) 147/81  04/09/20 (!) 148/74  01/04/20  (!) 172/77    Wt Readings from Last 3 Encounters:  07/09/20 113 lb 6.4 oz (51.4 kg)  04/09/20 112 lb (50.8 kg)  01/04/20 115 lb (52.2 kg)     Physical Exam Constitutional:      General: Amanda Juarez is not in acute distress.    Appearance: Amanda Juarez is well-developed.  HENT:     Head: Normocephalic and atraumatic.  Eyes:     Conjunctiva/sclera: Conjunctivae normal.     Pupils: Pupils are equal, round, and reactive to light.  Neck:     Thyroid: No thyromegaly.  Cardiovascular:     Rate and Rhythm: Normal rate and regular rhythm.     Heart sounds: Normal heart sounds. No murmur heard.   Pulmonary:     Effort: Pulmonary effort is normal. No respiratory distress.     Breath sounds: Normal breath sounds. No wheezing or rales.  Abdominal:     General: Bowel sounds are normal. There is no distension.     Palpations: Abdomen is soft.     Tenderness: There is no abdominal tenderness.  Musculoskeletal:        General: Normal range of motion.     Cervical back: Normal range of motion and neck supple.  Lymphadenopathy:     Cervical: No cervical adenopathy.  Skin:    General: Skin is warm and dry.  Neurological:     Mental Status: Amanda Juarez is alert and oriented to person,  place, and time.  Psychiatric:        Behavior: Behavior normal.        Thought Content: Thought content normal.        Judgment: Judgment normal.       Assessment & Plan:   Amanda Juarez was seen today for establish with new provider.  Diagnoses and all orders for this visit:  Primary hypertension -     amLODipine (NORVASC) 2.5 MG tablet; Take 1 tablet (2.5 mg total) by mouth daily. -     CBC with Differential/Platelet -     CMP14+EGFR  Age related osteoporosis, unspecified pathological fracture presence -     denosumab (PROLIA) 60 MG/ML SOSY injection; Inject 60 mg into the skin once for 1 dose. -     CBC with Differential/Platelet -     CMP14+EGFR  Encounter for vitamin deficiency screening -     CBC with  Differential/Platelet -     CMP14+EGFR       I have discontinued Amanda Juarez's amLODipine. I am also having Amanda Juarez start on amLODipine and denosumab. Additionally, I am having Amanda Juarez maintain Amanda Juarez Ergocalciferol, calcium carbonate, and alendronate.  Allergies as of 07/09/2020      Reactions   Titanium Dioxide       Medication List       Accurate as of Jul 09, 2020  3:38 PM. If you have any questions, ask your nurse or doctor.        alendronate 70 MG tablet Commonly known as: FOSAMAX Take 1 tablet (70 mg total) by mouth every 7 (seven) days. Take with a full glass of water on an empty stomach. Avoid lying down for 30 minute after taking.   amLODipine 2.5 MG tablet Commonly known as: NORVASC Take 1 tablet (2.5 mg total) by mouth daily. What changed:   medication strength  how much to take Changed by: Claretta Fraise, MD   calcium carbonate 600 MG Tabs tablet Commonly known as: OS-CAL Take 2 tablets (1,200 mg total) by mouth daily with breakfast.   denosumab 60 MG/ML Sosy injection Commonly known as: PROLIA Inject 60 mg into the skin once for 1 dose. Started by: Claretta Fraise, MD   Ergocalciferol 10 MCG (400 UNIT) Tabs Take 2 tablets by mouth daily.        Follow-up: Return in about 3 months (around 10/09/2020).  Claretta Fraise, M.D.

## 2020-07-10 LAB — CBC WITH DIFFERENTIAL/PLATELET
Basophils Absolute: 0.1 10*3/uL (ref 0.0–0.2)
Basos: 1 %
EOS (ABSOLUTE): 0.1 10*3/uL (ref 0.0–0.4)
Eos: 1 %
Hematocrit: 39.5 % (ref 34.0–46.6)
Hemoglobin: 12.6 g/dL (ref 11.1–15.9)
Immature Grans (Abs): 0 10*3/uL (ref 0.0–0.1)
Immature Granulocytes: 0 %
Lymphocytes Absolute: 2.4 10*3/uL (ref 0.7–3.1)
Lymphs: 34 %
MCH: 29.1 pg (ref 26.6–33.0)
MCHC: 31.9 g/dL (ref 31.5–35.7)
MCV: 91 fL (ref 79–97)
Monocytes Absolute: 0.6 10*3/uL (ref 0.1–0.9)
Monocytes: 8 %
Neutrophils Absolute: 3.9 10*3/uL (ref 1.4–7.0)
Neutrophils: 56 %
Platelets: 274 10*3/uL (ref 150–450)
RBC: 4.33 x10E6/uL (ref 3.77–5.28)
RDW: 14.5 % (ref 11.7–15.4)
WBC: 7 10*3/uL (ref 3.4–10.8)

## 2020-07-10 LAB — CMP14+EGFR
ALT: 12 IU/L (ref 0–32)
AST: 12 IU/L (ref 0–40)
Albumin/Globulin Ratio: 1.5 (ref 1.2–2.2)
Albumin: 4.1 g/dL (ref 3.7–4.7)
Alkaline Phosphatase: 52 IU/L (ref 44–121)
BUN/Creatinine Ratio: 31 — ABNORMAL HIGH (ref 12–28)
BUN: 22 mg/dL (ref 8–27)
Bilirubin Total: <0.2 mg/dL (ref 0.0–1.2)
CO2: 24 mmol/L (ref 20–29)
Calcium: 9.6 mg/dL (ref 8.7–10.3)
Chloride: 98 mmol/L (ref 96–106)
Creatinine, Ser: 0.71 mg/dL (ref 0.57–1.00)
Globulin, Total: 2.8 g/dL (ref 1.5–4.5)
Glucose: 167 mg/dL — ABNORMAL HIGH (ref 65–99)
Potassium: 4.2 mmol/L (ref 3.5–5.2)
Sodium: 137 mmol/L (ref 134–144)
Total Protein: 6.9 g/dL (ref 6.0–8.5)
eGFR: 86 mL/min/{1.73_m2} (ref >59)

## 2020-07-14 ENCOUNTER — Other Ambulatory Visit: Payer: Self-pay | Admitting: Family Medicine

## 2020-07-14 LAB — HGB A1C W/O EAG: Hgb A1c MFr Bld: 6.2 % — ABNORMAL HIGH (ref 4.8–5.6)

## 2020-07-14 LAB — SPECIMEN STATUS REPORT

## 2020-07-14 MED ORDER — METFORMIN HCL ER 500 MG PO TB24: 500.0000 mg | ORAL_TABLET | Freq: Every day | ORAL | 2 refills | Status: DC

## 2020-07-15 ENCOUNTER — Other Ambulatory Visit: Payer: Self-pay

## 2020-07-15 MED ORDER — METFORMIN HCL ER 500 MG PO TB24: 500.0000 mg | ORAL_TABLET | Freq: Every day | ORAL | 2 refills | Status: DC

## 2020-07-18 ENCOUNTER — Telehealth: Payer: Self-pay | Admitting: Family Medicine

## 2020-07-18 NOTE — Telephone Encounter (Signed)
Patient says Fosamax is too expensive, $90, and she cannot afford it.  She wants to try Prolia instead.  I explained to patient that this may be more expensive but she would like [place] to see what it will cost versus the Fosamax. Patient is aware you are out of the office today.

## 2020-07-21 ENCOUNTER — Other Ambulatory Visit: Payer: Self-pay | Admitting: Family Medicine

## 2020-07-21 MED ORDER — DENOSUMAB 60 MG/ML ~~LOC~~ SOSY: 60.0000 mg | PREFILLED_SYRINGE | Freq: Once | SUBCUTANEOUS | 1 refills | Status: AC

## 2020-07-21 NOTE — Telephone Encounter (Signed)
Pt aware and cannot commit to that price

## 2020-07-21 NOTE — Telephone Encounter (Signed)
I ordered the Prolia. Computer estimates her cost at $95 per 6 mos.

## 2020-07-23 ENCOUNTER — Other Ambulatory Visit: Payer: Self-pay | Admitting: Family Medicine

## 2020-07-23 ENCOUNTER — Telehealth: Payer: Self-pay | Admitting: Family Medicine

## 2020-07-23 DIAGNOSIS — M810 Age-related osteoporosis without current pathological fracture: Secondary | ICD-10-CM

## 2020-07-23 MED ORDER — ALENDRONATE SODIUM 70 MG PO TABS
70.0000 mg | ORAL_TABLET | ORAL | 11 refills | Status: DC
Start: 2020-07-23 — End: 2021-11-23

## 2020-07-23 NOTE — Telephone Encounter (Signed)
Please let the patient know that I sent their prescription to their pharmacy. Thanks, WS 

## 2020-07-23 NOTE — Telephone Encounter (Signed)
PATIENT AWARE

## 2020-07-24 ENCOUNTER — Ambulatory Visit (INDEPENDENT_AMBULATORY_CARE_PROVIDER_SITE_OTHER): Payer: Medicare HMO | Admitting: Family Medicine

## 2020-07-24 ENCOUNTER — Other Ambulatory Visit: Payer: Self-pay

## 2020-07-24 ENCOUNTER — Encounter: Payer: Self-pay | Admitting: Family Medicine

## 2020-07-24 VITALS — BP 150/82 | HR 107 | Temp 98.3°F | Ht 63.0 in | Wt 114.0 lb

## 2020-07-24 DIAGNOSIS — I10 Essential (primary) hypertension: Secondary | ICD-10-CM | POA: Diagnosis not present

## 2020-07-24 DIAGNOSIS — H6121 Impacted cerumen, right ear: Secondary | ICD-10-CM | POA: Diagnosis not present

## 2020-07-24 NOTE — Progress Notes (Signed)
No chief complaint on file.   HPI  Patient presents today for feeling of fullness and diminished hearing on the right.  The left is normal.  She does have a little bit of allergic rhinitis from time to time.  Otherwise no your symptoms.  She has had a little posterior drainage as part of the rhinitis as well.  Patient mentioned that her blood pressure is much lower at home than here and she is not sure she is going to take the blood pressure pill.   PMH: Smoking status noted ROS: Per HPI  Objective: BP (!) 150/82   Pulse (!) 107   Temp 98.3 F (36.8 C)   Ht 5\' 3"  (1.6 m)   Wt 114 lb (51.7 kg)   SpO2 97%   BMI 20.19 kg/m  Gen: NAD, alert, cooperative with exam HEENT: NCAT, EOMI, PERRL.  The right TM/ear canal is impacted with cerumen.  This was removed with lavage and hearing was restored.  The left TM had some scarring.  The canal was free of wax. Neuro: Alert and oriented, No gross deficits  Assessment and plan:  1. Primary hypertension   2. Impacted cerumen of right ear     I advised her that she should take her medicines as ordered for now.  Check blood pressure regularly at home and bring readings with her to her next follow-up.    Follow up as needed for this problem and as previously directed blood pressure. , MD

## 2020-07-29 ENCOUNTER — Telehealth: Payer: Self-pay | Admitting: Family Medicine

## 2020-07-29 DIAGNOSIS — H9313 Tinnitus, bilateral: Secondary | ICD-10-CM

## 2020-07-29 NOTE — Telephone Encounter (Signed)
REFERRAL REQUEST Telephone Note  Have you been seen at our office for this problem? yes (Advise that they may need an appointment with their PCP before a referral can be done)  Reason for Referral: ears are still giving her a problem Referral discussed with patient: not sure Best contact number of patient for referral team:   #[phone] Has patient been seen by a specialist for this issue before: no Patient provider preference for referral:   soon as possible? Patient location preference for referral: no   Patient notified that referrals can take up to a week or longer to process. If they haven't heard anything within a week they should call back and speak with the referral department.   Stacks' pt.  Please call soon.

## 2020-07-30 NOTE — Telephone Encounter (Signed)
Pts daughter Johnnie Moten) called to check status of referral to ENT for pt. Explained to her that Dr Darlyn Read is on vacation this week and wont return until next week to review referral request.  Pts daughter needs advise on what pt can do in the meantime because she says she still cant hear very well and keeps complaining of hearing a buzzing sound.  Please advise and call Crystal at [phone]

## 2020-07-30 NOTE — Telephone Encounter (Signed)
Covering PCP  °Please review and advise  °

## 2020-07-31 DIAGNOSIS — H60331 Swimmer's ear, right ear: Secondary | ICD-10-CM | POA: Diagnosis not present

## 2020-07-31 DIAGNOSIS — H6981 Other specified disorders of Eustachian tube, right ear: Secondary | ICD-10-CM | POA: Diagnosis not present

## 2020-07-31 NOTE — Telephone Encounter (Signed)
Sent referral for the patient

## 2020-08-05 ENCOUNTER — Ambulatory Visit (INDEPENDENT_AMBULATORY_CARE_PROVIDER_SITE_OTHER): Payer: Medicare HMO

## 2020-08-05 VITALS — Ht 63.0 in | Wt 114.0 lb

## 2020-08-05 DIAGNOSIS — Z0000 Encounter for general adult medical examination without abnormal findings: Secondary | ICD-10-CM

## 2020-08-05 NOTE — Progress Notes (Signed)
Subjective:   Amanda HorsemanDoris T Juarez is a 80 y.o. female who presents for an Initial Medicare Annual Wellness Visit.  Virtual Visit via Telephone Note  I connected with  Amanda Juarez on 08/05/20 at  9:00 AM EDT by telephone and verified that I am speaking with the correct person using two identifiers.  Location: Patient: Home Provider: [facility] Persons participating in the virtual visit: patient/Nurse Health Advisor   I discussed the limitations, risks, security and privacy concerns of performing an evaluation and management service by telephone and the availability of in person appointments. The patient expressed understanding and agreed to proceed.  Interactive audio and video telecommunications were attempted between this nurse and patient, however failed, due to patient having technical difficulties OR patient did not have access to video capability.  We continued and completed visit with audio only.  Some vital signs may be absent or patient reported.   Magaret Justo E Galdino Hinchman, LPN  Review of Systems     Cardiac Risk Factors include: advanced age (>4455men, 74>65 women);diabetes mellitus;hypertension;sedentary lifestyle     Objective:    Today's Vitals   08/05/20 0853  Weight: 114 lb (51.7 kg)  Height: 5\' 3"  (1.6 m)   Body mass index is 20.19 kg/m.  Advanced Directives 08/05/2020 09/24/2011 09/23/2011  Does Patient Have a Medical Advance Directive? Yes - Patient does not have advance directive;Patient would not like information  Type of Public librarianAdvance Directive Healthcare Power of CobbAttorney;Living will - -  Copy of Healthcare Power of Attorney in Chart? No - copy requested - -  Pre-existing out of facility DNR order (yellow form or pink MOST form) - No -    Current Medications (verified) Outpatient Encounter Medications as of 08/05/2020  Medication Sig  . acetic acid 2 % otic solution Administer 4 drops to the right ear Three (3) times a day for 7 days.  Marland Kitchen. amLODipine (NORVASC) 2.5 MG tablet Take 1  tablet (2.5 mg total) by mouth daily.  . calcium carbonate (OS-CAL) 600 MG TABS tablet Take 2 tablets (1,200 mg total) by mouth daily with breakfast.  . Ergocalciferol 10 MCG (400 UNIT) TABS Take 2 tablets by mouth daily.  Marland Kitchen. ipratropium (ATROVENT) 0.06 % nasal spray 2 sprays into each nostril Three (3) times a day.  . metFORMIN (GLUCOPHAGE-XR) 500 MG 24 hr tablet Take 1 tablet (500 mg total) by mouth daily with breakfast.  . alendronate (FOSAMAX) 70 MG tablet Take 1 tablet (70 mg total) by mouth every 7 (seven) days. Take with a full glass of water on an empty stomach. Avoid lying down for 30 minute after taking. (Patient not taking: Reported on 08/05/2020)   No facility-administered encounter medications on file as of 08/05/2020.    Allergies (verified) Titanium dioxide   History: Past Medical History:  Diagnosis Date  . Anxiety   . Dementia (HCC)   . Hypertension    Past Surgical History:  Procedure Laterality Date  . childbirth  [phone],1960,1965,1972   vaginal deliveries  . WRIST SURGERY Left    Family History  Problem Relation Age of Onset  . Heart disease Mother   . Hypertension Mother   . Hernia Father   . Hypertension Brother    Social History   Socioeconomic History  . Marital status: Widowed    Spouse name: Not on file  . Number of children: 4  . Years of education: Not on file  . Highest education level: Not on file  Occupational History  . Not on  file  Tobacco Use  . Smoking status: Never Smoker  . Smokeless tobacco: Never Used  Vaping Use  . Vaping Use: Never used  Substance and Sexual Activity  . Alcohol use: No  . Drug use: No  . Sexual activity: Not Currently  Other Topics Concern  . Not on file  Social History Narrative   Lives alone - son helps her with yard work   One daughter passed from cancer in 2001   Social Determinants of Health   Financial Resource Strain: Low Risk   . Difficulty of Paying Living Expenses: Not very hard  Food  Insecurity: No Food Insecurity  . Worried About Programme researcher, broadcasting/film/video in the Last Year: Never true  . Ran Out of Food in the Last Year: Never true  Transportation Needs: No Transportation Needs  . Lack of Transportation (Medical): No  . Lack of Transportation (Non-Medical): No  Physical Activity: Insufficiently Active  . Days of Exercise per Week: 7 days  . Minutes of Exercise per Session: 20 min  Stress: No Stress Concern Present  . Feeling of Stress : Only a little  Social Connections: Moderately Integrated  . Frequency of Communication with Friends and Family: More than three times a week  . Frequency of Social Gatherings with Friends and Family: More than three times a week  . Attends Religious Services: 1 to 4 times per year  . Active Member of Clubs or Organizations: Yes  . Attends Banker Meetings: 1 to 4 times per year  . Marital Status: Widowed    Tobacco Counseling Counseling given: Not Answered   Clinical Intake:  Pre-visit preparation completed: Yes  Pain : No/denies pain     BMI - recorded: 20.19 Nutritional Status: BMI of 19-24  Normal Nutritional Risks: None Diabetes: Yes CBG done?: No Did pt. bring in CBG monitor from home?: No  How often do you need to have someone help you when you read instructions, pamphlets, or other written materials from your doctor or pharmacy?: 1 - Never  Nutrition Risk Assessment:  Has the patient had any N/V/D within the last 2 months?  No  Does the patient have any non-healing wounds?  No  Has the patient had any unintentional weight loss or weight gain?  No   Diabetes:  Is the patient diabetic?  Yes  If diabetic, was a CBG obtained today?  No  Did the patient bring in their glucometer from home?  No  How often do you monitor your CBG's? Doesn't check at home.   Financial Strains and Diabetes Management:  Are you having any financial strains with the device, your supplies or your medication? No .  Does  the patient want to be seen by Chronic Care Management for management of their diabetes?  No  Would the patient like to be referred to a Nutritionist or for Diabetic Management?  No   Diabetic Exams:  Diabetic Eye Exam: Completed 06/06/20.   Diabetic Foot Exam: Completed unknown. Pt has been advised about the importance in completing this exam. Pt is scheduled for diabetic foot exam on 09/2020.   Interpreter Needed?: No  Information entered by :: Jarita Raval, LPN   Activities of Daily Living In your present state of health, do you have any difficulty performing the following activities: 08/05/2020  Hearing? Y  Vision? N  Difficulty concentrating or making decisions? N  Walking or climbing stairs? Y  Dressing or bathing? N  Doing errands, shopping? N  Preparing Food and eating ? N  Using the Toilet? N  In the past six months, have you accidently leaked urine? N  Do you have problems with loss of bowel control? N  Managing your Medications? N  Managing your Finances? N  Housekeeping or managing your Housekeeping? N  Some recent data might be hidden    Patient Care Team: Mechele Claude, MD as PCP - General (Family Medicine) Michaelle Copas, MD as Referring Physician (Optometry)  Indicate any recent Medical Services you may have received from other than [facility] providers in the past year (date may be approximate).     Assessment:   This is a routine wellness examination for Amanda Juarez.  Hearing/Vision screen  Hearing Screening   125Hz  250Hz  500Hz  1000Hz  2000Hz  3000Hz  4000Hz  6000Hz  8000Hz   Right ear:           Left ear:           Comments: C/o moderate hearing loss that she thinks is worsening - is considering hearing aids - I recommended she call [facility] in [place] or call insurance and see who is in network  Vision Screening Comments: Wears eyeglasses - up to date with annual eye exams with Dr  Dietary issues and exercise activities discussed: Current Exercise  Habits: Home exercise routine, Type of exercise: walking;Other - see comments (lots of yard work), Time (Minutes): 20, Frequency (Times/Week): 7, Weekly Exercise (Minutes/Week): 140, Intensity: Mild, Exercise limited by: orthopedic condition(s)  Goals Addressed            This Visit's Progress   . Prevent falls        Depression Screen PHQ 2/9 Scores 08/05/2020 07/24/2020 07/24/2020 07/09/2020 07/09/2020 04/09/2020 01/04/2020  PHQ - 2 Score 0 1 0 0 0 0 1  PHQ- 9 Score 2 4 - - - - -    Fall Risk Fall Risk  08/05/2020 07/24/2020 07/09/2020 07/09/2020 04/09/2020  Falls in the past year? 1 1 1  0 0  Number falls in past yr: 0 0 0 - -  Injury with Fall? 1 0 0 - -  Risk for fall due to : Impaired balance/gait;History of fall(s);Orthopedic patient;Impaired vision History of fall(s) History of fall(s) - -  Follow up Education provided;Falls prevention discussed Falls evaluation completed Falls evaluation completed - -    FALL RISK PREVENTION PERTAINING TO THE HOME:  Any stairs in or around the home? Yes  If so, are there any without handrails? No  Home free of loose throw rugs in walkways, pet beds, electrical cords, etc? Yes  Adequate lighting in your home to reduce risk of falls? Yes   ASSISTIVE DEVICES UTILIZED TO PREVENT FALLS:  Life alert? No  Use of a cane, walker or w/c? Yes  Grab bars in the bathroom? Yes  Shower chair or bench in shower? Yes  Elevated toilet seat or a handicapped toilet? Yes   TIMED UP AND GO:  Was the test performed? No . Telephonic visit  Cognitive Function:      6CIT Screen 08/05/2020  What Year? 0 points  What month? 0 points  What time? 0 points  Count back from 20 0 points  Months in reverse 2 points  Repeat phrase 2 points  Total Score 4    Immunizations  There is no immunization history on file for this patient.  TDAP status: Due, Education has been provided regarding the importance of this vaccine. Advised may receive this vaccine at local pharmacy  or Health Dept. Aware to provide a copy of the vaccination record if obtained from local pharmacy or Health Dept. Verbalized acceptance and understanding.  Flu Vaccine status: Declined, Education has been provided regarding the importance of this vaccine but patient still declined. Advised may receive this vaccine at local pharmacy or Health Dept. Aware to provide a copy of the vaccination record if obtained from local pharmacy or Health Dept. Verbalized acceptance and understanding.  Pneumococcal vaccine status: Declined,  Education has been provided regarding the importance of this vaccine but patient still declined. Advised may receive this vaccine at local pharmacy or Health Dept. Aware to provide a copy of the vaccination record if obtained from local pharmacy or Health Dept. Verbalized acceptance and understanding.   Covid-19 vaccine status: Declined, Education has been provided regarding the importance of this vaccine but patient still declined. Advised may receive this vaccine at local pharmacy or Health Dept.or vaccine clinic. Aware to provide a copy of the vaccination record if obtained from local pharmacy or Health Dept. Verbalized acceptance and understanding.  Qualifies for Shingles Vaccine? Yes   Zostavax completed No   Shingrix Completed?: No.    Education has been provided regarding the importance of this vaccine. Patient has been advised to call insurance company to determine out of pocket expense if they have not yet received this vaccine. Advised may also receive vaccine at local pharmacy or Health Dept. Verbalized acceptance and understanding.  Screening Tests Health Maintenance  Topic Date Due  . COVID-19 Vaccine (1) Never done  . Zoster Vaccines- Shingrix (1 of 2) Never done  . TETANUS/TDAP  01/03/2021 (Originally 11/11/1959)  . PNA vac Low Risk Adult (1 of 2 - PCV13) 01/03/2021 (Originally 11/10/2004)  . INFLUENZA VACCINE  09/29/2020  . DEXA SCAN  Completed  . Pneumococcal  Vaccine 90-25 Years old  Aged Out  . HPV VACCINES  Aged Out    Health Maintenance  Health Maintenance Due  Topic Date Due  . COVID-19 Vaccine (1) Never done  . Zoster Vaccines- Shingrix (1 of 2) Never done    Colorectal cancer screening: No longer required.   Mammogram status: No longer required due to age.  Bone Density status: Completed 01/10/2020. Results reflect: Bone density results: OSTEOPOROSIS. Repeat every 2 years.   Lung Cancer Screening: (Low Dose CT Chest recommended if Age 47-80 years, 30 pack-year currently smoking OR have quit w/in 15years.) does not qualify.   Additional Screening:  Hepatitis C Screening: does not qualify  Vision Screening: Recommended annual ophthalmology exams for early detection of glaucoma and other disorders of the eye. Is the patient up to date with their annual eye exam?  Yes  Who is the provider or what is the name of the office in which the patient attends annual eye exams? [facility] If pt is not established with a provider, would they like to be referred to a provider to establish care? No .   Dental Screening: Recommended annual dental exams for proper oral hygiene  Community Resource Referral / Chronic Care Management: CRR required this visit?  No   CCM required this visit?  No      Plan:     I have personally reviewed and noted the following in the patient's chart:   . Medical and social history . Use of alcohol, tobacco or illicit drugs  . Current medications and supplements including opioid prescriptions. Patient is not currently taking opioid prescriptions. . Functional ability and status . Nutritional status . Physical  activity . Advanced directives . List of other physicians . Hospitalizations, surgeries, and ER visits in previous 12 months . Vitals . Screenings to include cognitive, depression, and falls . Referrals and appointments  In addition, I have reviewed and discussed with patient certain preventive  protocols, quality metrics, and best practice recommendations. A written personalized care plan for preventive services as well as general preventive health recommendations were provided to patient.     [place] Constable, LPN   09/05/1163   Nurse Notes: None

## 2020-08-05 NOTE — Patient Instructions (Signed)
Amanda Juarez , Thank you for taking time to come for your Medicare Wellness Visit. I appreciate your ongoing commitment to your health goals. Please review the following plan we discussed and let me know if I can assist you in the future.   Screening recommendations/referrals: Colonoscopy: No longer required Mammogram: No longer required Bone Density: Done 01/10/2020 - Repeat every 2 years Recommended yearly ophthalmology/optometry visit for glaucoma screening and checkup Recommended yearly dental visit for hygiene and checkup  Vaccinations: Influenza vaccine: Patient declined Pneumococcal vaccine: Patient declined Tdap vaccine: Patient declined Shingles vaccine: Patient declined   Covid-19:Patient declined  Advanced directives: Please bring a copy of your health care power of attorney and living will to the office to be added to your chart at your convenience.  Conditions/risks identified: Aim for 30 minutes of exercise every day - do weight bearing exercises, walking and balance exercises; drink 6-8 glasses of water and eat lots of fruits and vegetables. Remember to eat foods high in Calcium and Vitamin D and take supplements to help bone strength.  Next appointment: Follow up in one year for your annual wellness visit    Preventive Care 65 Years and Older, Female Preventive care refers to lifestyle choices and visits with your health care provider that can promote health and wellness. What does preventive care include?  A yearly physical exam. This is also called an annual well check.  Dental exams once or twice a year.  Routine eye exams. Ask your health care provider how often you should have your eyes checked.  Personal lifestyle choices, including:  Daily care of your teeth and gums.  Regular physical activity.  Eating a healthy diet.  Avoiding tobacco and drug use.  Limiting alcohol use.  Practicing safe sex.  Taking low-dose aspirin every day.  Taking vitamin  and mineral supplements as recommended by your health care provider. What happens during an annual well check? The services and screenings done by your health care provider during your annual well check will depend on your age, overall health, lifestyle risk factors, and family history of disease. Counseling  Your health care provider may ask you questions about your:  Alcohol use.  Tobacco use.  Drug use.  Emotional well-being.  Home and relationship well-being.  Sexual activity.  Eating habits.  History of falls.  Memory and ability to understand (cognition).  Work and work Astronomer.  Reproductive health. Screening  You may have the following tests or measurements:  Height, weight, and BMI.  Blood pressure.  Lipid and cholesterol levels. These may be checked every 5 years, or more frequently if you are over 28 years old.  Skin check.  Lung cancer screening. You may have this screening every year starting at age 33 if you have a 30-pack-year history of smoking and currently smoke or have quit within the past 15 years.  Fecal occult blood test (FOBT) of the stool. You may have this test every year starting at age 85.  Flexible sigmoidoscopy or colonoscopy. You may have a sigmoidoscopy every 5 years or a colonoscopy every 10 years starting at age 34.  Hepatitis C blood test.  Hepatitis B blood test.  Sexually transmitted disease (STD) testing.  Diabetes screening. This is done by checking your blood sugar (glucose) after you have not eaten for a while (fasting). You may have this done every 1-3 years.  Bone density scan. This is done to screen for osteoporosis. You may have this done starting at age 75.  Mammogram.  This may be done every 1-2 years. Talk to your health care provider about how often you should have regular mammograms. Talk with your health care provider about your test results, treatment options, and if necessary, the need for more  tests. Vaccines  Your health care provider may recommend certain vaccines, such as:  Influenza vaccine. This is recommended every year.  Tetanus, diphtheria, and acellular pertussis (Tdap, Td) vaccine. You may need a Td booster every 10 years.  Zoster vaccine. You may need this after age 9.  Pneumococcal 13-valent conjugate (PCV13) vaccine. One dose is recommended after age 89.  Pneumococcal polysaccharide (PPSV23) vaccine. One dose is recommended after age 60. Talk to your health care provider about which screenings and vaccines you need and how often you need them. This information is not intended to replace advice given to you by your health care provider. Make sure you discuss any questions you have with your health care provider. Document Released: 03/14/2015 Document Revised: 11/05/2015 Document Reviewed: 12/17/2014 Elsevier Interactive Patient Education  2017 [place] Prevention in the Home Falls can cause injuries. They can happen to people of all ages. There are many things you can do to make your home safe and to help prevent falls. What can I do on the outside of my home?  Regularly fix the edges of walkways and driveways and fix any cracks.  Remove anything that might make you trip as you walk through a door, such as a raised step or threshold.  Trim any bushes or trees on the path to your home.  Use bright outdoor lighting.  Clear any walking paths of anything that might make someone trip, such as rocks or tools.  Regularly check to see if handrails are loose or broken. Make sure that both sides of any steps have handrails.  Any raised decks and porches should have guardrails on the edges.  Have any leaves, snow, or ice cleared regularly.  Use sand or salt on walking paths during winter.  Clean up any spills in your garage right away. This includes oil or grease spills. What can I do in the bathroom?  Use night lights.  Install grab bars by the  toilet and in the tub and shower. Do not use towel bars as grab bars.  Use non-skid mats or decals in the tub or shower.  If you need to sit down in the shower, use a plastic, non-slip stool.  Keep the floor dry. Clean up any water that spills on the floor as soon as it happens.  Remove soap buildup in the tub or shower regularly.  Attach bath mats securely with double-sided non-slip rug tape.  Do not have throw rugs and other things on the floor that can make you trip. What can I do in the bedroom?  Use night lights.  Make sure that you have a light by your bed that is easy to reach.  Do not use any sheets or blankets that are too big for your bed. They should not hang down onto the floor.  Have a firm chair that has side arms. You can use this for support while you get dressed.  Do not have throw rugs and other things on the floor that can make you trip. What can I do in the kitchen?  Clean up any spills right away.  Avoid walking on wet floors.  Keep items that you use a lot in easy-to-reach places.  If you need to reach something  above you, use a strong step stool that has a grab bar.  Keep electrical cords out of the way.  Do not use floor polish or wax that makes floors slippery. If you must use wax, use non-skid floor wax.  Do not have throw rugs and other things on the floor that can make you trip. What can I do with my stairs?  Do not leave any items on the stairs.  Make sure that there are handrails on both sides of the stairs and use them. Fix handrails that are broken or loose. Make sure that handrails are as long as the stairways.  Check any carpeting to make sure that it is firmly attached to the stairs. Fix any carpet that is loose or worn.  Avoid having throw rugs at the top or bottom of the stairs. If you do have throw rugs, attach them to the floor with carpet tape.  Make sure that you have a light switch at the top of the stairs and the bottom of  the stairs. If you do not have them, ask someone to add them for you. What else can I do to help prevent falls?  Wear shoes that:  Do not have high heels.  Have rubber bottoms.  Are comfortable and fit you well.  Are closed at the toe. Do not wear sandals.  If you use a stepladder:  Make sure that it is fully opened. Do not climb a closed stepladder.  Make sure that both sides of the stepladder are locked into place.  Ask someone to hold it for you, if possible.  Clearly mark and make sure that you can see:  Any grab bars or handrails.  First and last steps.  Where the edge of each step is.  Use tools that help you move around (mobility aids) if they are needed. These include:  Canes.  Walkers.  Scooters.  Crutches.  Turn on the lights when you go into a dark area. Replace any light bulbs as soon as they burn out.  Set up your furniture so you have a clear path. Avoid moving your furniture around.  If any of your floors are uneven, fix them.  If there are any pets around you, be aware of where they are.  Review your medicines with your doctor. Some medicines can make you feel dizzy. This can increase your chance of falling. Ask your doctor what other things that you can do to help prevent falls. This information is not intended to replace advice given to you by your health care provider. Make sure you discuss any questions you have with your health care provider. Document Released: 12/12/2008 Document Revised: 07/24/2015 Document Reviewed: 03/22/2014 Elsevier Interactive Patient Education  2017 [employer].

## 2020-08-14 DIAGNOSIS — H9110 Presbycusis, unspecified ear: Secondary | ICD-10-CM | POA: Diagnosis not present

## 2020-10-09 ENCOUNTER — Ambulatory Visit: Payer: Medicare HMO | Admitting: Family Medicine

## 2020-10-15 ENCOUNTER — Encounter: Payer: Self-pay | Admitting: Family Medicine

## 2020-10-22 ENCOUNTER — Telehealth: Payer: Self-pay | Admitting: Family Medicine

## 2020-10-22 NOTE — Telephone Encounter (Signed)
Sounds great!

## 2020-10-22 NOTE — Telephone Encounter (Signed)
Patient aware and verbalized understanding. °

## 2020-10-27 ENCOUNTER — Other Ambulatory Visit: Payer: Self-pay

## 2020-10-27 ENCOUNTER — Ambulatory Visit (INDEPENDENT_AMBULATORY_CARE_PROVIDER_SITE_OTHER): Payer: Medicare HMO | Admitting: Family Medicine

## 2020-10-27 ENCOUNTER — Encounter: Payer: Self-pay | Admitting: Family Medicine

## 2020-10-27 VITALS — BP 184/89 | HR 109 | Temp 97.6°F | Resp 20 | Ht 63.0 in | Wt 110.4 lb

## 2020-10-27 DIAGNOSIS — E1169 Type 2 diabetes mellitus with other specified complication: Secondary | ICD-10-CM | POA: Diagnosis not present

## 2020-10-27 DIAGNOSIS — E785 Hyperlipidemia, unspecified: Secondary | ICD-10-CM | POA: Diagnosis not present

## 2020-10-27 DIAGNOSIS — I10 Essential (primary) hypertension: Secondary | ICD-10-CM

## 2020-10-27 DIAGNOSIS — M810 Age-related osteoporosis without current pathological fracture: Secondary | ICD-10-CM | POA: Diagnosis not present

## 2020-10-27 DIAGNOSIS — Z9114 Patient's other noncompliance with medication regimen: Secondary | ICD-10-CM | POA: Diagnosis not present

## 2020-10-27 DIAGNOSIS — E559 Vitamin D deficiency, unspecified: Secondary | ICD-10-CM

## 2020-10-27 DIAGNOSIS — E119 Type 2 diabetes mellitus without complications: Secondary | ICD-10-CM

## 2020-10-27 DIAGNOSIS — Z91148 Patient's other noncompliance with medication regimen for other reason: Secondary | ICD-10-CM | POA: Insufficient documentation

## 2020-10-27 LAB — BAYER DCA HB A1C WAIVED: HB A1C (BAYER DCA - WAIVED): 6 % (ref <7.0)

## 2020-10-27 NOTE — Progress Notes (Signed)
Subjective:  Patient ID: Amanda Juarez, female    DOB: 1940/07/16, 80 y.o.   MRN: [id number]  Patient Care Team: Claretta Fraise, MD as PCP - General (Family Medicine) Harlen Labs, MD as Referring Physician (Optometry) Marin Comment, Allison Quarry, MD as Referring Physician (Optometry)   Chief Complaint:  Establish Care and Medical Management of Chronic Issues   HPI: Amanda Juarez is a 80 y.o. female presenting on 10/27/2020 for Establish Care and Medical Management of Chronic Issues   Pt presents today for management of chronic medical conditions and to possibly switch providers. Pt states she has been placed on medications for diabetes, osteoporosis, and hypertension but feels she does not need them so she does not take them. States her previous 2 providers prescribed these medications and she felt they were unnecessary. Pts T score in 2021 was -4.3 indicating osteoporosis. She was to start Prolia and did not, she was prescribed fosamax but is not taking. States she does not have osteoporosis and does not need the medications. Discussed in detail the test results and indications for medications.  BP elevated in office today. Pt states when she takes the low dose Norvasc her BP drops so she does not feel she needs the medications. She denies headaches, blurred vision, chest pain, leg swelling, or shortness of breath. Does have dizziness at times, no other neurological deficits.  She is not taking her metformin and does not check her blood sugars at home. States she does not have diabetes and the only reason her blood sugar was elevated was due to increased intake of candy. Pt denies polyuria, polyphagia, or polydipsia.    Relevant past medical, surgical, family, and social history reviewed and updated as indicated.  Allergies and medications reviewed and updated. Data reviewed: Chart in Epic.   Past Medical History:  Diagnosis Date   Anxiety    Dementia ([place])    Hypertension     Past  Surgical History:  Procedure Laterality Date   childbirth  1959,1960,1965,1972   vaginal deliveries   WRIST SURGERY Left     Social History   Socioeconomic History   Marital status: Widowed    Spouse name: Not on file   Number of children: 4   Years of education: Not on file   Highest education level: Not on file  Occupational History   Not on file  Tobacco Use   Smoking status: Never   Smokeless tobacco: Never  Vaping Use   Vaping Use: Never used  Substance and Sexual Activity   Alcohol use: No   Drug use: No   Sexual activity: Not Currently  Other Topics Concern   Not on file  Social History Narrative   Lives alone - son helps her with yard work   One daughter passed from cancer in 2001   Social Determinants of Health   Financial Resource Strain: Low Risk    Difficulty of Paying Living Expenses: Not very hard  Food Insecurity: No Food Insecurity   Worried About Charity fundraiser in the Last Year: Never true   Arboriculturist in the Last Year: Never true  Transportation Needs: No Transportation Needs   Lack of Transportation (Medical): No   Lack of Transportation (Non-Medical): No  Physical Activity: Insufficiently Active   Days of Exercise per Week: 7 days   Minutes of Exercise per Session: 20 min  Stress: No Stress Concern Present   Feeling of Stress : Only  a little  Social Connections: Moderately Integrated   Frequency of Communication with Friends and Family: More than three times a week   Frequency of Social Gatherings with Friends and Family: More than three times a week   Attends Religious Services: 1 to 4 times per year   Active Member of [employer] or Organizations: Yes   Attends Archivist Meetings: 1 to 4 times per year   Marital Status: Widowed  Intimate Partner Violence: Not At Risk   Fear of Current or Ex-Partner: No   Emotionally Abused: No   Physically Abused: No   Sexually Abused: No    Outpatient Encounter Medications as of  10/27/2020  Medication Sig   alendronate (FOSAMAX) 70 MG tablet Take 1 tablet (70 mg total) by mouth every 7 (seven) days. Take with a full glass of water on an empty stomach. Avoid lying down for 30 minute after taking. (Patient not taking: No sig reported)   amLODipine (NORVASC) 2.5 MG tablet Take 1 tablet (2.5 mg total) by mouth daily. (Patient not taking: Reported on 10/27/2020)   calcium carbonate (OS-CAL) 600 MG TABS tablet Take 2 tablets (1,200 mg total) by mouth daily with breakfast. (Patient not taking: Reported on 10/27/2020)   Ergocalciferol 10 MCG (400 UNIT) TABS Take 2 tablets by mouth daily. (Patient not taking: Reported on 10/27/2020)   ipratropium (ATROVENT) 0.06 % nasal spray 2 sprays into each nostril Three (3) times a day. (Patient not taking: Reported on 10/27/2020)   metFORMIN (GLUCOPHAGE-XR) 500 MG 24 hr tablet Take 1 tablet (500 mg total) by mouth daily with breakfast. (Patient not taking: Reported on 10/27/2020)   No facility-administered encounter medications on file as of 10/27/2020.    Allergies  Allergen Reactions   Titanium Dioxide     Review of Systems  Constitutional:  Negative for activity change, appetite change, chills, diaphoresis, fatigue, fever and unexpected weight change.  HENT:  Positive for hearing loss.   Eyes: Negative.  Negative for photophobia and visual disturbance.  Respiratory:  Negative for cough, chest tightness and shortness of breath.   Cardiovascular:  Negative for chest pain, palpitations and leg swelling.  Gastrointestinal:  Negative for abdominal pain, blood in stool, constipation, diarrhea, nausea and vomiting.  Endocrine: Negative.  Negative for polydipsia, polyphagia and polyuria.  Genitourinary:  Negative for decreased urine volume, difficulty urinating, dysuria, frequency and urgency.  Musculoskeletal:  Negative for arthralgias and myalgias.  Skin: Negative.   Allergic/Immunologic: Negative.   Neurological:  Positive for dizziness.  Negative for tremors, seizures, syncope, facial asymmetry, speech difficulty, weakness, light-headedness, numbness and headaches.  Hematological: Negative.   Psychiatric/Behavioral:  Negative for confusion, hallucinations, sleep disturbance and suicidal ideas.   All other systems reviewed and are negative.      Objective:  BP (!) 184/89   Pulse (!) 109   Temp 97.6 F (36.4 C) (Temporal)   Resp 20   Ht '5\' 3"'  (1.6 m)   Wt 110 lb 6 oz (50.1 kg)   SpO2 96%   BMI 19.55 kg/m    Wt Readings from Last 3 Encounters:  10/27/20 110 lb 6 oz (50.1 kg)  08/05/20 114 lb (51.7 kg)  07/24/20 114 lb (51.7 kg)    Physical Exam Vitals and nursing note reviewed.  Constitutional:      General: She is not in acute distress.    Appearance: Normal appearance. She is well-developed, well-groomed and normal weight. She is not ill-appearing, toxic-appearing or diaphoretic.  HENT:  Head: Normocephalic and atraumatic.     Jaw: There is normal jaw occlusion.     Right Ear: Decreased hearing noted.     Left Ear: Decreased hearing noted.     Ears:     Comments: Bilateral hearing aids    Nose: Nose normal.     Mouth/Throat:     Lips: Pink.     Mouth: Mucous membranes are moist.     Pharynx: Oropharynx is clear. Uvula midline.  Eyes:     General: Lids are normal.     Extraocular Movements: Extraocular movements intact.     Conjunctiva/sclera: Conjunctivae normal.     Pupils: Pupils are equal, round, and reactive to light.  Neck:     Thyroid: No thyroid mass, thyromegaly or thyroid tenderness.     Vascular: No carotid bruit or JVD.     Trachea: Trachea and phonation normal.  Cardiovascular:     Rate and Rhythm: Normal rate and regular rhythm.     Chest Wall: PMI is not displaced.     Pulses: Normal pulses.     Heart sounds: Normal heart sounds. No murmur heard.   No friction rub. No gallop.  Pulmonary:     Effort: Pulmonary effort is normal. No respiratory distress.     Breath sounds:  Normal breath sounds. No wheezing.  Abdominal:     General: Bowel sounds are normal. There is no distension or abdominal bruit.     Palpations: Abdomen is soft. There is no hepatomegaly or splenomegaly.     Tenderness: There is no abdominal tenderness. There is no right CVA tenderness or left CVA tenderness.     Hernia: No hernia is present.  Musculoskeletal:        General: Normal range of motion.     Cervical back: Normal range of motion and neck supple.     Right lower leg: No edema.     Left lower leg: No edema.  Lymphadenopathy:     Cervical: No cervical adenopathy.  Skin:    General: Skin is warm and dry.     Capillary Refill: Capillary refill takes less than 2 seconds.     Coloration: Skin is not cyanotic, jaundiced or pale.     Findings: No rash.  Neurological:     General: No focal deficit present.     Mental Status: She is alert and oriented to person, place, and time.     Cranial Nerves: Cranial nerves are intact. No cranial nerve deficit.     Sensory: Sensation is intact. No sensory deficit.     Motor: Motor function is intact. No weakness.     Coordination: Coordination is intact. Coordination normal.     Gait: Gait is intact. Gait normal.     Deep Tendon Reflexes: Reflexes are normal and symmetric. Reflexes normal.  Psychiatric:        Attention and Perception: Attention and perception normal.        Mood and Affect: Mood and affect normal.        Speech: Speech normal.        Behavior: Behavior normal. Behavior is cooperative.        Thought Content: Thought content normal.        Cognition and Memory: Cognition and memory normal.        Judgment: Judgment normal.    Results for orders placed or performed in visit on 10/27/20  Bayer DCA Hb A1c Waived  Result Value Ref Range  HB A1C (BAYER DCA - WAIVED) 6.0 <7.0 %       Pertinent labs & imaging results that were available during my care of the patient were reviewed by me and considered in my medical decision  making.  Assessment & Plan:  Kaydin was seen today for establish care and medical management of chronic issues.  Diagnoses and all orders for this visit:  Primary hypertension Pt has not been taking her medications as she feels she does not need them. Manual BP 172/92. Advised to restart amlodipine 2.5 mg daily. DASH diet and exercise encouraged. Labs pending.  -     Microalbumin / creatinine urine ratio -     Bayer DCA Hb A1c Waived -     CBC with Differential/Platelet -     CMP14+EGFR -     Lipid panel  Controlled type 2 diabetes mellitus without complication, without long-term current use of insulin (HCC) A1C 6.0. Not taking medications. Will recheck A1C in 3 months and if warranted, will restart medications. Labs pending.  -     Microalbumin / creatinine urine ratio -     Bayer DCA Hb A1c Waived -     CMP14+EGFR -     Lipid panel  Hyperlipidemia associated with type 2 diabetes mellitus ([place]) Not taking medications. Labs pending. Diet and exercise encouraged. Will start medications if warranted, pt declined initiating today.  -     Lipid panel  Age-related osteoporosis without current pathological fracture Not taking alendronate and does not wish to restart. Last T score -4.3. discussed the importance of medication compliance along with Vit D and calcium intake. Repeat DEXA next year.  -     CBC with Differential/Platelet -     VITAMIN D 25 Hydroxy (Vit-D Deficiency, Fractures)  Vitamin D deficiency Not on repletion therapy. Discussed importance in detail. Labs pending.  -     VITAMIN D 25 Hydroxy (Vit-D Deficiency, Fractures)  Noncompliance with medication regimen Importance of compliance with medication regimen discussed in detail.  Pt to continue care with Dr. Livia Snellen as switching providers in office will not change the need for medications due to underlying health conditions.    Continue all other maintenance medications.  Follow up plan: Return in about 3 months  (around 01/27/2021), or if symptoms worsen or fail to improve, for Stacks.   Continue healthy lifestyle choices, including diet (rich in fruits, vegetables, and lean proteins, and low in salt and simple carbohydrates) and exercise (at least 30 minutes of moderate physical activity daily).  Educational handout given for DM  The above assessment and management plan was discussed with the patient. The patient verbalized understanding of and has agreed to the management plan. Patient is aware to call the clinic if they develop any new symptoms or if symptoms persist or worsen. Patient is aware when to return to the clinic for a follow-up visit. Patient educated on when it is appropriate to go to the emergency department.   Monia Pouch, FNP-C [facility] [phone]

## 2020-10-27 NOTE — Patient Instructions (Addendum)
You need to take your medications as prescribed. You need to follow up with Dr. Darlyn Read in 3 months.    Understanding your Hemoglobin A1c:     Diabetes Mellitus and Nutrition    I think that you would greatly benefit from seeing a nutritionist. If this is something you are interested in, please call Dr Gerilyn Pilgrim at [phone] to schedule an appointment.   When you have diabetes (diabetes mellitus), it is very important to have healthy eating habits because your blood sugar (glucose) levels are greatly affected by what you eat and drink. Eating healthy foods in the appropriate amounts, at about the same times every day, can help you: Control your blood glucose. Lower your risk of heart disease. Improve your blood pressure. Reach or maintain a healthy weight.  Every person with diabetes is different, and each person has different needs for a meal plan. Your health care provider may recommend that you work with a diet and nutrition specialist (dietitian) to make a meal plan that is best for you. Your meal plan may vary depending on factors such as: The calories you need. The medicines you take. Your weight. Your blood glucose, blood pressure, and cholesterol levels. Your activity level. Other health conditions you have, such as heart or kidney disease.  How do carbohydrates affect me? Carbohydrates affect your blood glucose level more than any other type of food. Eating carbohydrates naturally increases the amount of glucose in your blood. Carbohydrate counting is a method for keeping track of how many carbohydrates you eat. Counting carbohydrates is important to keep your blood glucose at a healthy level, especially if you use insulin or take certain oral diabetes medicines. It is important to know how many carbohydrates you can safely have in each meal. This is different for every person. Your dietitian can help you calculate how many carbohydrates you should have at each meal and for  snack. Foods that contain carbohydrates include: Bread, cereal, rice, pasta, and crackers. Potatoes and corn. Peas, beans, and lentils. Milk and yogurt. Fruit and juice. Desserts, such as cakes, cookies, ice cream, and candy.  How does alcohol affect me? Alcohol can cause a sudden decrease in blood glucose (hypoglycemia), especially if you use insulin or take certain oral diabetes medicines. Hypoglycemia can be a life-threatening condition. Symptoms of hypoglycemia (sleepiness, dizziness, and confusion) are similar to symptoms of having too much alcohol. If your health care provider says that alcohol is safe for you, follow these guidelines: Limit alcohol intake to no more than 1 drink per day for nonpregnant women and 2 drinks per day for men. One drink equals 12 oz of beer, 5 oz of wine, or 1 oz of hard liquor. Do not drink on an empty stomach. Keep yourself hydrated with water, diet soda, or unsweetened iced tea. Keep in mind that regular soda, juice, and other mixers may contain a lot of sugar and must be counted as carbohydrates.  What are tips for following this plan?  Reading food labels Start by checking the serving size on the label. The amount of calories, carbohydrates, fats, and other nutrients listed on the label are based on one serving of the food. Many foods contain more than one serving per package. Check the total grams (g) of carbohydrates in one serving. You can calculate the number of servings of carbohydrates in one serving by dividing the total carbohydrates by 15. For example, if a food has 30 g of total carbohydrates, it would be equal to 2  servings of carbohydrates. Check the number of grams (g) of saturated and trans fats in one serving. Choose foods that have low or no amount of these fats. Check the number of milligrams (mg) of sodium in one serving. Most people should limit total sodium intake to less than 2,300 mg per day. Always check the nutrition information  of foods labeled as "low-fat" or "nonfat". These foods may be higher in added sugar or refined carbohydrates and should be avoided. Talk to your dietitian to identify your daily goals for nutrients listed on the label.  Shopping Avoid buying canned, premade, or processed foods. These foods tend to be high in fat, sodium, and added sugar. Shop around the outside edge of the grocery store. This includes fresh fruits and vegetables, bulk grains, fresh meats, and fresh dairy.  Cooking Use low-heat cooking methods, such as baking, instead of high-heat cooking methods like deep frying. Cook using healthy oils, such as olive, canola, or sunflower oil. Avoid cooking with butter, cream, or high-fat meats.  Meal planning Eat meals and snacks regularly, preferably at the same times every day. Avoid going long periods of time without eating. Eat foods high in fiber, such as fresh fruits, vegetables, beans, and whole grains. Talk to your dietitian about how many servings of carbohydrates you can eat at each meal. Eat 4-6 ounces of lean protein each day, such as lean meat, chicken, fish, eggs, or tofu. 1 ounce is equal to 1 ounce of meat, chicken, or fish, 1 egg, or 1/4 cup of tofu. Eat some foods each day that contain healthy fats, such as avocado, nuts, seeds, and fish.  Lifestyle  Check your blood glucose regularly. Exercise at least 30 minutes 5 or more days each week, or as told by your health care provider. Take medicines as told by your health care provider. Do not use any products that contain nicotine or tobacco, such as cigarettes and e-cigarettes. If you need help quitting, ask your health care provider. Work with a Veterinary surgeon or diabetes educator to identify strategies to manage stress and any emotional and social challenges.  What are some questions to ask my health care provider? Do I need to meet with a diabetes educator? Do I need to meet with a dietitian? What number can I call if I  have questions? When are the best times to check my blood glucose?  Where to find more information: American Diabetes Association: diabetes.org/food-and-fitness/food Academy of Nutrition and Dietetics: https://www.vargas.com/ [employer] of Diabetes and Digestive and Kidney Diseases (NIH): FindJewelers.cz  Summary A healthy meal plan will help you control your blood glucose and maintain a healthy lifestyle. Working with a diet and nutrition specialist (dietitian) can help you make a meal plan that is best for you. Keep in mind that carbohydrates and alcohol have immediate effects on your blood glucose levels. It is important to count carbohydrates and to use alcohol carefully. This information is not intended to replace advice given to you by your health care provider. Make sure you discuss any questions you have with your health care provider. Document Released: 11/12/2004 Document Revised: 03/22/2016 Document Reviewed: 03/22/2016 Elsevier Interactive Patient Education  [employer].

## 2020-10-29 LAB — VITAMIN D 25 HYDROXY (VIT D DEFICIENCY, FRACTURES): Vit D, 25-Hydroxy: 24.9 ng/mL — ABNORMAL LOW (ref 30.0–100.0)

## 2020-10-29 LAB — SPECIMEN STATUS REPORT

## 2020-10-31 LAB — LIPID PANEL
Chol/HDL Ratio: 3.4 ratio (ref 0.0–4.4)
Cholesterol, Total: 253 mg/dL — ABNORMAL HIGH (ref 100–199)
HDL: 74 mg/dL (ref >39)
LDL Chol Calc (NIH): 147 mg/dL — ABNORMAL HIGH (ref 0–99)
Triglycerides: 179 mg/dL — ABNORMAL HIGH (ref 0–149)
VLDL Cholesterol Cal: 32 mg/dL (ref 5–40)

## 2020-10-31 LAB — CBC WITH DIFFERENTIAL/PLATELET
Basophils Absolute: 0.1 10*3/uL (ref 0.0–0.2)
Basos: 1 %
EOS (ABSOLUTE): 0 10*3/uL (ref 0.0–0.4)
Eos: 0 %
Hematocrit: 39.5 % (ref 34.0–46.6)
Hemoglobin: 13.2 g/dL (ref 11.1–15.9)
Immature Grans (Abs): 0 10*3/uL (ref 0.0–0.1)
Immature Granulocytes: 0 %
Lymphocytes Absolute: 2.4 10*3/uL (ref 0.7–3.1)
Lymphs: 34 %
MCH: 30.1 pg (ref 26.6–33.0)
MCHC: 33.4 g/dL (ref 31.5–35.7)
MCV: 90 fL (ref 79–97)
Monocytes Absolute: 0.4 10*3/uL (ref 0.1–0.9)
Monocytes: 6 %
Neutrophils Absolute: 4.1 10*3/uL (ref 1.4–7.0)
Neutrophils: 59 %
Platelets: 293 10*3/uL (ref 150–450)
RBC: 4.39 x10E6/uL (ref 3.77–5.28)
RDW: 13.6 % (ref 11.7–15.4)
WBC: 7 10*3/uL (ref 3.4–10.8)

## 2020-10-31 LAB — CMP14+EGFR
ALT: 6 IU/L (ref 0–32)
AST: 14 IU/L (ref 0–40)
Albumin/Globulin Ratio: 1.8 (ref 1.2–2.2)
Albumin: 4.3 g/dL (ref 3.7–4.7)
Alkaline Phosphatase: 55 IU/L (ref 44–121)
BUN/Creatinine Ratio: 24 (ref 12–28)
BUN: 18 mg/dL (ref 8–27)
Bilirubin Total: 0.2 mg/dL (ref 0.0–1.2)
CO2: 19 mmol/L — ABNORMAL LOW (ref 20–29)
Calcium: 9.3 mg/dL (ref 8.7–10.3)
Chloride: 95 mmol/L — ABNORMAL LOW (ref 96–106)
Creatinine, Ser: 0.75 mg/dL (ref 0.57–1.00)
Globulin, Total: 2.4 g/dL (ref 1.5–4.5)
Glucose: 143 mg/dL — ABNORMAL HIGH (ref 65–99)
Potassium: 4 mmol/L (ref 3.5–5.2)
Sodium: 134 mmol/L (ref 134–144)
Total Protein: 6.7 g/dL (ref 6.0–8.5)
eGFR: 80 mL/min/{1.73_m2} (ref >59)

## 2020-11-25 DIAGNOSIS — H9193 Unspecified hearing loss, bilateral: Secondary | ICD-10-CM | POA: Diagnosis not present

## 2020-11-25 DIAGNOSIS — Z9114 Patient's other noncompliance with medication regimen: Secondary | ICD-10-CM | POA: Diagnosis not present

## 2020-11-25 DIAGNOSIS — E785 Hyperlipidemia, unspecified: Secondary | ICD-10-CM | POA: Diagnosis not present

## 2020-11-25 DIAGNOSIS — I10 Essential (primary) hypertension: Secondary | ICD-10-CM | POA: Diagnosis not present

## 2020-11-25 DIAGNOSIS — E119 Type 2 diabetes mellitus without complications: Secondary | ICD-10-CM | POA: Diagnosis not present

## 2020-12-23 DIAGNOSIS — I10 Essential (primary) hypertension: Secondary | ICD-10-CM | POA: Diagnosis not present

## 2020-12-23 DIAGNOSIS — Z9114 Patient's other noncompliance with medication regimen: Secondary | ICD-10-CM | POA: Diagnosis not present

## 2021-01-27 ENCOUNTER — Ambulatory Visit: Payer: Medicare HMO | Admitting: Family Medicine

## 2021-03-31 DIAGNOSIS — D649 Anemia, unspecified: Secondary | ICD-10-CM | POA: Diagnosis not present

## 2021-03-31 DIAGNOSIS — E46 Unspecified protein-calorie malnutrition: Secondary | ICD-10-CM | POA: Diagnosis not present

## 2021-03-31 DIAGNOSIS — Z9114 Patient's other noncompliance with medication regimen: Secondary | ICD-10-CM | POA: Diagnosis not present

## 2021-03-31 DIAGNOSIS — I10 Essential (primary) hypertension: Secondary | ICD-10-CM | POA: Diagnosis not present

## 2021-08-06 ENCOUNTER — Telehealth: Payer: Self-pay

## 2021-08-06 VITALS — BP 139/54 | Wt 105.0 lb

## 2021-08-06 NOTE — Progress Notes (Signed)
I did half of visit with patient before she told me she is now going to Dr Shellia Carwin with [facility] - disregard this visit note

## 2021-08-06 NOTE — Telephone Encounter (Signed)
Now going to [place] - seeing Dr Ileana Roup

## 2021-08-24 ENCOUNTER — Ambulatory Visit: Payer: Self-pay | Admitting: Audiologist

## 2021-09-09 ENCOUNTER — Ambulatory Visit (INDEPENDENT_AMBULATORY_CARE_PROVIDER_SITE_OTHER): Payer: Medicare HMO

## 2021-09-09 ENCOUNTER — Ambulatory Visit
Admission: EM | Admit: 2021-09-09 | Discharge: 2021-09-09 | Disposition: A | Discharge status: Home / Self Care | Payer: Medicare HMO | Attending: Nurse Practitioner | Admitting: Nurse Practitioner

## 2021-09-09 ENCOUNTER — Other Ambulatory Visit: Payer: Self-pay

## 2021-09-09 ENCOUNTER — Encounter: Payer: Self-pay | Admitting: Emergency Medicine

## 2021-09-09 ENCOUNTER — Inpatient Hospital Stay: Admission: RE | Admit: 2021-09-09 | Payer: Self-pay | Source: Ambulatory Visit

## 2021-09-09 DIAGNOSIS — M546 Pain in thoracic spine: Secondary | ICD-10-CM

## 2021-09-09 DIAGNOSIS — S22000A Wedge compression fracture of unspecified thoracic vertebra, initial encounter for closed fracture: Secondary | ICD-10-CM

## 2021-09-09 DIAGNOSIS — R079 Chest pain, unspecified: Secondary | ICD-10-CM

## 2021-09-09 LAB — POCT FASTING CBG KUC MANUAL ENTRY: POCT Glucose (KUC): 135 mg/dL — AB (ref 70–99)

## 2021-09-09 MED ORDER — ACETAMINOPHEN ER 650 MG PO TBCR
650.0000 mg | EXTENDED_RELEASE_TABLET | Freq: Three times a day (TID) | ORAL | 0 refills | Status: AC | PRN
Start: 2021-09-09 — End: ?

## 2021-09-09 MED ORDER — DICLOFENAC SODIUM 1 % EX GEL: 4.0000 g | Freq: Four times a day (QID) | CUTANEOUS | 0 refills | Status: AC

## 2021-09-09 MED ORDER — LIDOCAINE 5 % EX PTCH: 1.0000 | MEDICATED_PATCH | CUTANEOUS | 0 refills | Status: AC

## 2021-09-09 NOTE — ED Notes (Signed)
Rechecked blood pressure with a result of 200/94. NP aware.

## 2021-09-09 NOTE — Discharge Instructions (Addendum)
The x-rays of your back show multiple compression fractures.  You will need to follow-up with [facility] for further evaluation.  Their number is [phone]. Use medication as prescribed. Go to the Emergency Room for your blood pressure per Dr. Wyline Mood with [facility]. Go the the Emergency Room if  your pain is very bad and it suddenly gets worse, you are unable to move any body part (paralysis) that is below the level of your injury, you have numbness, tingling, weakness in any body part that is below the level of your injury, you cannot control your bladder or bowel. Follow-up as needed.

## 2021-09-09 NOTE — ED Notes (Signed)
During [facility] visit, PCP called pt and reported due to elevated BP needs to go to ED for further evaluation.

## 2021-09-09 NOTE — ED Triage Notes (Signed)
Pt family reports pt has had several falls recently. Denies dizziness or any symptoms at time of fall. Pt reports "slipped and tripped in the yard all the time." Denies being on any blood thinners, hitting head, or loc.   Pt reports intermittent bilateral rib pain, mid chest pain and thoracic region of back.

## 2021-09-09 NOTE — ED Provider Notes (Signed)
[facility]    CSN: [id number] Arrival date & time: 09/09/21  1226      History   Chief Complaint Chief Complaint  Patient presents with   Fall    HPI Amanda Juarez is a 81 y.o. female.   The history is provided by the patient and a caregiver.   Patient presents with a caregiver/family member after a fall that occurred 1 week ago.  Patient complains of bilateral rib cage pain, sternal chest pain, and thoracic spine pain.  Patient states "it just does not feel right when I breathe".  Patient denies shortness of breath, difficulty breathing, or trouble breathing.  Patient's family member informs that patient has fallen more frequently over the past several weeks.  She states over the past 2 to 3 weeks, patient has fallen approximately 3 times.  Patient has a known history of hypertension and diabetes.  Family member states patient refuses to take her medication.  Patient denies falling, tripping, or slipping with her fall.  She further denies hitting her head or loss of consciousness with the fall.  She is not on any blood thinners.  Past Medical History:  Diagnosis Date   Anxiety    Dementia (HCC)    Hypertension     Patient Active Problem List   Diagnosis Date Noted   Controlled type 2 diabetes mellitus without complication, without long-term current use of insulin (HCC) 10/27/2020   Hyperlipidemia associated with type 2 diabetes mellitus (HCC) 10/27/2020   Age related osteoporosis 10/27/2020   Vitamin D deficiency 10/27/2020   Noncompliance with medication regimen 10/27/2020   Hypertension associated with diabetes (HCC) 09/25/2011    Past Surgical History:  Procedure Laterality Date   childbirth  1959,1960,1965,1972   vaginal deliveries   WRIST SURGERY Left     OB History   No obstetric history on file.      Home Medications    Prior to Admission medications   Medication Sig Start Date End Date Taking? Authorizing Provider  acetaminophen  (TYLENOL 8 HOUR) 650 MG CR tablet Take 1 tablet (650 mg total) by mouth every 8 (eight) hours as needed for pain. 09/09/21  Yes Caytlyn Evers-Warren, Sadie Haber, NP  diclofenac Sodium (VOLTAREN ARTHRITIS PAIN) 1 % GEL Apply 4 g topically 4 (four) times daily. 09/09/21  Yes Livingston Denner-Warren, Sadie Haber, NP  lidocaine (LIDODERM) 5 % Place 1 patch onto the skin daily. Remove & Discard patch within 12 hours or as directed by MD 09/09/21  Yes Tykia Mellone-Warren, Sadie Haber, NP  alendronate (FOSAMAX) 70 MG tablet Take 1 tablet (70 mg total) by mouth every 7 (seven) days. Take with a full glass of water on an empty stomach. Avoid lying down for 30 minute after taking. Patient not taking: Reported on 08/05/2020 07/23/20   Mechele Claude, MD  amLODipine (NORVASC) 2.5 MG tablet Take 1 tablet (2.5 mg total) by mouth daily. Patient not taking: Reported on 09/09/2021 07/09/20   Mechele Claude, MD  calcium carbonate (OS-CAL) 600 MG TABS tablet Take 2 tablets (1,200 mg total) by mouth daily with breakfast. Patient not taking: Reported on 10/27/2020 01/11/20   Gabriel Earing, FNP  Ergocalciferol 10 MCG (400 UNIT) TABS Take 2 tablets by mouth daily. Patient not taking: Reported on 10/27/2020 01/11/20   Gabriel Earing, FNP  ipratropium (ATROVENT) 0.06 % nasal spray 2 sprays into each nostril Three (3) times a day. Patient not taking: Reported on 10/27/2020 07/31/20 07/31/21  [provider]  metFORMIN (GLUCOPHAGE-XR) 500  MG 24 hr tablet Take 1 tablet (500 mg total) by mouth daily with breakfast. Patient not taking: Reported on 10/27/2020 07/15/20   Mechele Claude, MD    Family History Family History  Problem Relation Age of Onset   Heart disease Mother    Hypertension Mother    Hernia Father    Hypertension Brother     Social History Social History   Tobacco Use   Smoking status: Never   Smokeless tobacco: Never  Vaping Use   Vaping Use: Never used  Substance Use Topics   Alcohol use: No   Drug use: No      Allergies   Titanium dioxide   Review of Systems Review of Systems Per HPI  Physical Exam Triage Vital Signs ED Triage Vitals  Enc Vitals Group     BP 09/09/21 1241 (!) 199/93     Pulse Rate 09/09/21 1241 95     Resp 09/09/21 1241 18     Temp 09/09/21 1241 98.4 F (36.9 C)     Temp Source 09/09/21 1241 Oral     SpO2 09/09/21 1241 95 %     Weight --      Height --      Head Circumference --      Peak Flow --      Pain Score 09/09/21 1242 5     Pain Loc --      Pain Edu? --      Excl. in GC? --    No data found.  Updated Vital Signs BP (!) 199/93 (BP Location: Right Arm)   Pulse 95   Temp 98.4 F (36.9 C) (Oral)   Resp 18   SpO2 95%   Visual Acuity Right Eye Distance:   Left Eye Distance:   Bilateral Distance:    Right Eye Near:   Left Eye Near:    Bilateral Near:     Physical Exam Vitals and nursing note reviewed.  Constitutional:      General: She is not in acute distress.    Appearance: She is well-developed.  HENT:     Head: Normocephalic and atraumatic.  Eyes:     Conjunctiva/sclera: Conjunctivae normal.  Cardiovascular:     Rate and Rhythm: Normal rate and regular rhythm.     Heart sounds: No murmur heard. Pulmonary:     Effort: Pulmonary effort is normal. No respiratory distress.     Breath sounds: Normal breath sounds.  Abdominal:     Palpations: Abdomen is soft.     Tenderness: There is no abdominal tenderness.  Musculoskeletal:        General: No swelling.     Cervical back: Neck supple.     Thoracic back: Deformity (kyphosis present), tenderness (diffuse tenderness noted to thoracic spine) and bony tenderness present. No swelling or edema.  Skin:    General: Skin is warm and dry.     Capillary Refill: Capillary refill takes less than 2 seconds.  Neurological:     Mental Status: She is alert.  Psychiatric:        Mood and Affect: Mood normal.      UC Treatments / Results  Labs (all labs ordered are listed, but only  abnormal results are displayed) Labs Reviewed  POCT FASTING CBG [facility] MANUAL ENTRY - Abnormal; Notable for the following components:      Result Value   POCT Glucose ([facility]) 135 (*)    All other components within normal limits    EKG  Radiology DG Thoracic Spine 2 View  Result Date: 09/09/2021 CLINICAL DATA:  Back pain EXAM: THORACIC SPINE 2 VIEWS COMPARISON:  Chest radiograph done on 09/21/2011 FINDINGS: There is interval appearance of compression fractures in multiple mid and lower thoracic vertebral bodies, more so in T9 vertebral body with approximately 60% decrease in height. Alignment of posterior margins of vertebral bodies is unremarkable. There is exaggeration of the kyphotic curvature. Osteopenia is seen in bony structures. Arterial calcifications are seen. There is fixed hiatal hernia. IMPRESSION: There is interval appearance of compression fractures in multiple thoracic vertebral bodies, more so in the body of T9 vertebra with approximately 60% decrease in height. Other compression fractures are seen in the bodies of T4, T5, T6, T7, T8 and T12 vertebrae. Alignment of the posterior margins of the vertebral bodies is unremarkable. There is exaggeration of the kyphotic curvature in the thoracic spine. Electronically Signed   By: Ernie Avena M.D.   On: 09/09/2021 13:27   DG Chest 2 View  Result Date: 09/09/2021 CLINICAL DATA:  Chest pain, back pain EXAM: CHEST - 2 VIEW COMPARISON:  09/21/2011 FINDINGS: Transverse diameter of the heart is increased. There is soft tissue fullness in the retrocardiac region with air-fluid levels suggesting moderate to large fixed hiatal hernia. There are no signs of pulmonary edema or focal pulmonary consolidation. Increase in AP diameter of the chest suggests COPD. There is no significant pleural effusion or pneumothorax. IMPRESSION: There are no signs of pulmonary edema or focal pulmonary consolidation. COPD. Moderate to large fixed hiatal hernia.  Electronically Signed   By: Ernie Avena M.D.   On: 09/09/2021 13:21    Procedures Procedures (including critical care time)  Medications Ordered in UC Medications - No data to display  Initial Impression / Assessment and Plan / UC Course  I have reviewed the triage vital signs and the nursing notes.  Pertinent labs & imaging results that were available during my care of the patient were reviewed by me and considered in my medical decision making (see chart for details).  Patient presents for complaints of bilateral rib pain, chest pain, and thoracic back pain after several falls.  Patient's most recent fall was approximately 1 week ago.  On exam, patient has moderate kyphosis, difficult to determine point tenderness based on the patient's presentation and her spinal presentation.  X-rays show compression fractures from T4-T8 and T12. Spoke with [employer], Ashleigh. She advised that she spoke with Dr. Dutch Quint and patient could follow-up as an outpatient. Information was provided to the patient for [facility].  While in the office, patient's daughter-in-law called her PCP, Dr. Wyline Mood, and informed him of her blood pressure.  Patient's daughter-in-law informed PCP would like for her to go to urgent care for further evaluation.  Patient's son was advised to notify the emergency room physician of the findings of the patient's x-ray of her thoracic spine to determine if further imaging is warranted. Final Clinical Impressions(s) / UC Diagnoses   Final diagnoses:  Compression fracture of body of thoracic vertebra [facility])     Discharge Instructions      The x-rays of your back show multiple compression fractures.  You will need to follow-up with [facility] for further evaluation.  Their number is [phone]. Use medication as prescribed. Go to the Emergency Room for your blood pressure per Dr. Wyline Mood with [facility]. Go the the Emergency Room if  your pain is very  bad and it suddenly gets worse, you are unable to  move any body part (paralysis) that is below the level of your injury, you have numbness, tingling, weakness in any body part that is below the level of your injury, you cannot control your bladder or bowel. Follow-up as needed.       ED Prescriptions     Medication Sig Dispense Auth. Provider   lidocaine (LIDODERM) 5 % Place 1 patch onto the skin daily. Remove & Discard patch within 12 hours or as directed by MD 30 patch Deette Revak-Warren, Sadie Haber, NP   diclofenac Sodium (VOLTAREN ARTHRITIS PAIN) 1 % GEL Apply 4 g topically 4 (four) times daily. 150 g Shaquill Iseman-Warren, Sadie Haber, NP   acetaminophen (TYLENOL 8 HOUR) 650 MG CR tablet Take 1 tablet (650 mg total) by mouth every 8 (eight) hours as needed for pain. 30 tablet Alexandrya Chim-Warren, Sadie Haber, NP      PDMP not reviewed this encounter.   Abran Cantor, NP 09/09/21 1502

## 2021-11-11 NOTE — H&P (Signed)
Surgical History & Physical  Patient Name: Amanda Juarez DOB: 07-02-1940  Surgery: Cataract extraction with intraocular lens implant phacoemulsification; Left Eye  Surgeon: Fabio Pierce MD Surgery Date:  11-23-21 Pre-Op Date:  10-29-21  HPI: [name] patient present for cataract evaluation per Dr. Laural Benes. 1. The patient complains of sunlight glare causing poor vision, reading fine print, seeing captions or words on the TV, which began for an unknown amount of time. Both eyes are affected. The symptoms are constant. The condition is worse with daily activities. HPI Completed by Dr. Fabio Pierce  Medical History: Cataracts Asteroid Hyalosis OU High Blood Pressure  Review of Systems Negative Allergic/Immunologic Negative Cardiovascular Negative Constitutional Negative Ear, Nose, Mouth & Throat Negative Endocrine Negative Eyes Negative Gastrointestinal Negative Genitourinary Negative Hemotologic/Lymphatic Negative Integumentary Negative Musculoskeletal Negative Neurological Negative Psychiatry Negative Respiratory  Social   Never smoked   Medication Amlodipine, Tylenol,   Sx/Procedures Wrist Sx,   Drug Allergies  TITANUM DIOXIDE,   History & Physical: Heent: Cataract, left eye NECK: supple without bruits LUNGS: lungs clear to auscultation CV: regular rate and rhythm Abdomen: soft and non-tender Impression & Plan: Assessment: 1.  NUCLEAR SCLEROSIS AGE RELATED; Both Eyes (H25.13) 2.  BLEPHARITIS; Right Upper Lid, Right Lower Lid, Left Upper Lid, Left Lower Lid (H01.001, H01.002,H01.004,H01.005) 3.  ASTIGMATISM, REGULAR; Both Eyes (H52.223)  Plan: 1.  Cataract accounts for the patient's decreased vision. This visual impairment is not correctable with a tolerable change in glasses or contact lenses. Cataract surgery with an implantation of a new lens should significantly improve the visual and functional status of the patient. Discussed all risks,  benefits, alternatives, and potential complications. Discussed the procedures and recovery. Patient desires to have surgery. A-scan ordered and performed today for intra-ocular lens calculations. The surgery will be performed in order to improve vision for driving, reading, and for eye examinations. Recommend phacoemulsification with intra-ocular lens. Recommend Dextenza for post-operative pain and inflammation. Left Eye worse - first. Dilates poorly - shugarcaine by protocol. Malyugin Ring. Omidira. Recommend Toric IOL OU.  2.  Recommend regular lid cleaning.  3.  Recommend Toric IOL OU.

## 2021-11-17 ENCOUNTER — Encounter (HOSPITAL_COMMUNITY)
Admission: RE | Admit: 2021-11-17 | Discharge: 2021-11-17 | Disposition: A | Discharge status: Home / Self Care | Payer: Medicare HMO | Source: Ambulatory Visit | Attending: Ophthalmology | Admitting: Ophthalmology

## 2021-11-17 ENCOUNTER — Encounter (HOSPITAL_COMMUNITY): Payer: Self-pay

## 2021-11-23 ENCOUNTER — Encounter (HOSPITAL_COMMUNITY): Payer: Self-pay | Admitting: Ophthalmology

## 2021-11-23 ENCOUNTER — Encounter (HOSPITAL_COMMUNITY)
Admission: RE | Disposition: A | Discharge status: Home / Self Care | Payer: Self-pay | Source: Home / Self Care | Attending: Ophthalmology

## 2021-11-23 ENCOUNTER — Ambulatory Visit (HOSPITAL_BASED_OUTPATIENT_CLINIC_OR_DEPARTMENT_OTHER): Payer: Medicare HMO | Admitting: Certified Registered Nurse Anesthetist

## 2021-11-23 ENCOUNTER — Ambulatory Visit (HOSPITAL_COMMUNITY)
Admission: RE | Admit: 2021-11-23 | Discharge: 2021-11-23 | Disposition: A | Discharge status: Home / Self Care | Payer: Medicare HMO | Attending: Ophthalmology | Admitting: Ophthalmology

## 2021-11-23 ENCOUNTER — Ambulatory Visit (HOSPITAL_COMMUNITY): Payer: Medicare HMO | Admitting: Certified Registered Nurse Anesthetist

## 2021-11-23 DIAGNOSIS — F0394 Unspecified dementia, unspecified severity, with anxiety: Secondary | ICD-10-CM

## 2021-11-23 DIAGNOSIS — F419 Anxiety disorder, unspecified: Secondary | ICD-10-CM | POA: Insufficient documentation

## 2021-11-23 DIAGNOSIS — H2512 Age-related nuclear cataract, left eye: Secondary | ICD-10-CM

## 2021-11-23 DIAGNOSIS — E119 Type 2 diabetes mellitus without complications: Secondary | ICD-10-CM | POA: Diagnosis not present

## 2021-11-23 DIAGNOSIS — F0390 Unspecified dementia without behavioral disturbance: Secondary | ICD-10-CM | POA: Diagnosis not present

## 2021-11-23 DIAGNOSIS — I10 Essential (primary) hypertension: Secondary | ICD-10-CM

## 2021-11-23 HISTORY — PX: CATARACT EXTRACTION W/PHACO: SHX586

## 2021-11-23 SURGERY — PHACOEMULSIFICATION, CATARACT, WITH IOL INSERTION
Anesthesia: Monitor Anesthesia Care | Site: Eye | Laterality: Left

## 2021-11-23 MED ORDER — MOXIFLOXACIN HCL 0.5 % OP SOLN
OPHTHALMIC | Status: DC | PRN
  Administered 2021-11-23: 2 [drp] via OPHTHALMIC

## 2021-11-23 MED ORDER — LIDOCAINE HCL 3.5 % OP GEL
1.0000 | Freq: Once | OPHTHALMIC | Status: AC
  Administered 2021-11-23: 1 via OPHTHALMIC

## 2021-11-23 MED ORDER — FENTANYL CITRATE PF 50 MCG/ML IJ SOSY
PREFILLED_SYRINGE | INTRAMUSCULAR | Status: AC
  Filled 2021-11-23: qty 1

## 2021-11-23 MED ORDER — SODIUM CHLORIDE 0.9% FLUSH
INTRAVENOUS | Status: DC | PRN
  Administered 2021-11-23: 3 mL via INTRAVENOUS

## 2021-11-23 MED ORDER — EPINEPHRINE PF 1 MG/ML IJ SOLN
INTRAMUSCULAR | Status: AC
  Filled 2021-11-23: qty 1

## 2021-11-23 MED ORDER — TETRACAINE HCL 0.5 % OP SOLN
1.0000 [drp] | OPHTHALMIC | Status: AC | PRN
  Administered 2021-11-23 (×3): 1 [drp] via OPHTHALMIC

## 2021-11-23 MED ORDER — FENTANYL CITRATE (PF) 100 MCG/2ML IJ SOLN
INTRAMUSCULAR | Status: DC | PRN
  Administered 2021-11-23: 25 ug via INTRAVENOUS

## 2021-11-23 MED ORDER — STERILE WATER FOR IRRIGATION IR SOLN
Status: DC | PRN
  Administered 2021-11-23: 500 mL

## 2021-11-23 MED ORDER — BSS IO SOLN
INTRAOCULAR | Status: DC | PRN
  Administered 2021-11-23: 15 mL via INTRAOCULAR

## 2021-11-23 MED ORDER — MIDAZOLAM HCL 2 MG/2ML IJ SOLN
INTRAMUSCULAR | Status: AC
  Filled 2021-11-23: qty 2

## 2021-11-23 MED ORDER — POVIDONE-IODINE 5 % OP SOLN
OPHTHALMIC | Status: DC | PRN
  Administered 2021-11-23: 1 via OPHTHALMIC

## 2021-11-23 MED ORDER — PHENYLEPHRINE-KETOROLAC 1-0.3 % IO SOLN
INTRAOCULAR | Status: AC
  Filled 2021-11-23: qty 4

## 2021-11-23 MED ORDER — PHENYLEPHRINE HCL 2.5 % OP SOLN
1.0000 [drp] | OPHTHALMIC | Status: AC | PRN
  Administered 2021-11-23 (×3): 1 [drp] via OPHTHALMIC

## 2021-11-23 MED ORDER — TROPICAMIDE 1 % OP SOLN
1.0000 [drp] | OPHTHALMIC | Status: AC | PRN
  Administered 2021-11-23 (×3): 1 [drp] via OPHTHALMIC

## 2021-11-23 MED ORDER — SODIUM HYALURONATE 10 MG/ML IO SOLUTION
PREFILLED_SYRINGE | INTRAOCULAR | Status: DC | PRN
  Administered 2021-11-23: 0.85 mL via INTRAOCULAR

## 2021-11-23 MED ORDER — LIDOCAINE HCL (PF) 1 % IJ SOLN
INTRAOCULAR | Status: DC | PRN
  Administered 2021-11-23: 1 mL via OPHTHALMIC

## 2021-11-23 MED ORDER — SODIUM HYALURONATE 23MG/ML IO SOSY
PREFILLED_SYRINGE | INTRAOCULAR | Status: DC | PRN
  Administered 2021-11-23: 0.6 mL via INTRAOCULAR

## 2021-11-23 MED ORDER — PHENYLEPHRINE-KETOROLAC 1-0.3 % IO SOLN
INTRAOCULAR | Status: DC | PRN
  Administered 2021-11-23: 500 mL via OPHTHALMIC

## 2021-11-23 MED ORDER — FENTANYL CITRATE (PF) 100 MCG/2ML IJ SOLN
INTRAMUSCULAR | Status: AC
  Filled 2021-11-23: qty 2

## 2021-11-23 SURGICAL SUPPLY — 16 items
CATARACT SUITE SIGHTPATH (MISCELLANEOUS) ×1 IMPLANT
CLOTH BEACON ORANGE TIMEOUT ST (SAFETY) ×1 IMPLANT
DRAPE HALF SHEET 40X57 (DRAPES) IMPLANT
EYE SHIELD UNIVERSAL CLEAR (GAUZE/BANDAGES/DRESSINGS) IMPLANT
FEE CATARACT SUITE SIGHTPATH (MISCELLANEOUS) ×1 IMPLANT
GLOVE BIOGEL PI IND STRL 7.0 (GLOVE) ×2 IMPLANT
LENS IOL RAYNER 22.0 (Intraocular Lens) ×1 IMPLANT
LENS IOL RAYONE EMV 22.0 (Intraocular Lens) IMPLANT
NDL HYPO 18GX1.5 BLUNT FILL (NEEDLE) ×1 IMPLANT
NEEDLE HYPO 18GX1.5 BLUNT FILL (NEEDLE) ×1 IMPLANT
PAD ARMBOARD 7.5X6 YLW CONV (MISCELLANEOUS) ×1 IMPLANT
RING MALYGIN 7.0 (MISCELLANEOUS) IMPLANT
SYR TB 1ML LL NO SAFETY (SYRINGE) ×1 IMPLANT
TAPE SURG TRANSPORE 1 IN (GAUZE/BANDAGES/DRESSINGS) IMPLANT
TAPE SURGICAL TRANSPORE 1 IN (GAUZE/BANDAGES/DRESSINGS) ×1
WATER STERILE IRR 500ML POUR (IV SOLUTION) IMPLANT

## 2021-11-23 NOTE — Transfer of Care (Signed)
Immediate Anesthesia Transfer of Care Note  Patient: Amanda Juarez  Procedure(s) Performed: CATARACT EXTRACTION PHACO AND INTRAOCULAR LENS PLACEMENT (IOC) (Left: Eye)  Patient Location: Short Stay  Anesthesia Type:MAC  Level of Consciousness: awake and alert   Airway & Oxygen Therapy: Patient Spontanous Breathing  Post-op Assessment: Report given to RN and Post -op Vital signs reviewed and stable  Post vital signs: Reviewed and stable  Last Vitals:  Vitals Value Taken Time  BP 170/65   Temp 36   Pulse 90   Resp 16   SpO2 96     Last Pain:  Vitals:   11/23/21 1102  PainSc: 0-No pain         Complications: No notable events documented.

## 2021-11-23 NOTE — Anesthesia Preprocedure Evaluation (Signed)
Anesthesia Evaluation  Patient identified by MRN, date of birth, ID band Patient awake    Reviewed: Allergy & Precautions, H&P , NPO status , Patient's Chart, lab work & pertinent test results, reviewed documented beta blocker date and time   Airway Mallampati: II  TM Distance: >3 FB Neck ROM: full    Dental no notable dental hx.    Pulmonary neg pulmonary ROS,    Pulmonary exam normal breath sounds clear to auscultation       Cardiovascular Exercise Tolerance: Good hypertension, negative cardio ROS   Rhythm:regular Rate:Normal     Neuro/Psych PSYCHIATRIC DISORDERS Anxiety Dementia negative neurological ROS     GI/Hepatic negative GI ROS, Neg liver ROS,   Endo/Other  diabetes, Type 2  Renal/GU negative Renal ROS  negative genitourinary   Musculoskeletal   Abdominal   Peds  Hematology negative hematology ROS (+)   Anesthesia Other Findings   Reproductive/Obstetrics negative OB ROS                             Anesthesia Physical Anesthesia Plan  ASA: 3  Anesthesia Plan: General and MAC   Post-op Pain Management:    Induction:   PONV Risk Score and Plan:   Airway Management Planned:   Additional Equipment:   Intra-op Plan:   Post-operative Plan:   Informed Consent: I have reviewed the patients History and Physical, chart, labs and discussed the procedure including the risks, benefits and alternatives for the proposed anesthesia with the patient or authorized representative who has indicated his/her understanding and acceptance.     Dental Advisory Given  Plan Discussed with: CRNA  Anesthesia Plan Comments:         Anesthesia Quick Evaluation

## 2021-11-23 NOTE — Op Note (Signed)
Date of procedure: 11/23/21  Pre-operative diagnosis: Visually significant age-related nuclear cataract, Left Eye (H25.12)  Post-operative diagnosis: Visually significant age-related nuclear cataract, Left Eye  Procedure: Removal of cataract via phacoemulsification and insertion of intra-ocular lens Rayner [id number] +22.0D into the capsular bag of the Left Eye  Attending surgeon: Gerda Diss. Charli Halle, MD, MA  Anesthesia: MAC, Topical Akten  Complications: None  Estimated Blood Loss: <36m (minimal)  Specimens: None  Implants: As above  Indications:  Visually significant age-related cataract, Left Eye  Procedure:  The patient was seen and identified in the pre-operative area. The operative eye was identified and dilated.  The operative eye was marked.  Topical anesthesia was administered to the operative eye.     The patient was then to the operative suite and placed in the supine position.  A timeout was performed confirming the patient, procedure to be performed, and all other relevant information.   The patient's face was prepped and draped in the usual fashion for intra-ocular surgery.  A lid speculum was placed into the operative eye and the surgical microscope moved into place and focused.  An inferotemporal paracentesis was created using a 20 gauge paracentesis blade.  Shugarcaine was injected into the anterior chamber.  Viscoelastic was injected into the anterior chamber.  A temporal clear-corneal main wound incision was created using a 2.488mmicrokeratome.  A continuous curvilinear capsulorrhexis was initiated using an irrigating cystitome and completed using capsulorrhexis forceps.  Hydrodissection and hydrodeliniation were performed.  Viscoelastic was injected into the anterior chamber.  A phacoemulsification handpiece and a chopper as a second instrument were used to remove the nucleus and epinucleus. The irrigation/aspiration handpiece was used to remove any remaining cortical material.    The capsular bag was reinflated with viscoelastic, checked, and found to be intact.  The intraocular lens was inserted into the capsular bag.  The irrigation/aspiration handpiece was used to remove any remaining viscoelastic.  The clear corneal wound and paracentesis wounds were then hydrated and checked with Weck-Cels to be watertight.  The lid-speculum and drape was removed, and the patient's face was cleaned with a wet and dry 4x4.  Moxifloxacin drops were instilled on the eye.A clear shield was taped over the eye. The patient was taken to the post-operative care unit in good condition, having tolerated the procedure well.  Post-Op Instructions: [facility] and will receive all other orders and instructions.

## 2021-11-23 NOTE — Interval H&P Note (Signed)
History and Physical Interval Note:  11/23/2021 11:59 AM  Amanda Juarez  has presented today for surgery, with the diagnosis of nuclear sclerosis age related cataract; left.  The various methods of treatment have been discussed with the patient and family. After consideration of risks, benefits and other options for treatment, the patient has consented to  Procedure(s) with comments: CATARACT EXTRACTION PHACO AND INTRAOCULAR LENS PLACEMENT (IOC) (Left) - CDE: as a surgical intervention.  The patient's history has been reviewed, patient examined, no change in status, stable for surgery.  I have reviewed the patient's chart and labs.  Questions were answered to the patient's satisfaction.     Baruch Goldmann

## 2021-11-23 NOTE — Anesthesia Postprocedure Evaluation (Signed)
Anesthesia Post Note  Patient: SIMONA ROCQUE  Procedure(s) Performed: CATARACT EXTRACTION PHACO AND INTRAOCULAR LENS PLACEMENT (IOC) (Left: Eye)  Patient location during evaluation: Phase II Anesthesia Type: MAC Level of consciousness: awake Pain management: pain level controlled Vital Signs Assessment: post-procedure vital signs reviewed and stable Respiratory status: spontaneous breathing and respiratory function stable Cardiovascular status: blood pressure returned to baseline and stable Postop Assessment: no headache and no apparent nausea or vomiting Anesthetic complications: no Comments: Late entry   No notable events documented.   Last Vitals:  Vitals:   11/23/21 1102 11/23/21 1235  BP: (!) 143/67 (!) 177/89  Pulse: 79 100  Resp: 17 11  Temp: 36.4 C 36.6 C  SpO2: 99% 96%    Last Pain:  Vitals:   11/23/21 1235  TempSrc: Oral  PainSc: 0-No pain                 Louann Sjogren

## 2021-11-23 NOTE — Discharge Instructions (Addendum)
Please discharge patient when stable, will follow up today with Dr. Wrzosek at the [facility] office immediately following discharge.  Leave shield in place until visit.  All paperwork with discharge instructions will be given at the office.  [facility] Address:  [address]  [place], [place] [address]  

## 2021-11-24 ENCOUNTER — Encounter (HOSPITAL_COMMUNITY): Payer: Self-pay | Admitting: Ophthalmology

## 2021-11-30 ENCOUNTER — Encounter (HOSPITAL_COMMUNITY)
Admission: RE | Admit: 2021-11-30 | Discharge: 2021-11-30 | Disposition: A | Discharge status: Home / Self Care | Payer: Medicare HMO | Source: Ambulatory Visit | Attending: Ophthalmology | Admitting: Ophthalmology

## 2021-12-02 NOTE — H&P (Signed)
Surgical History & Physical  Patient Name: Amanda Juarez DOB: 1940-07-13  Surgery: Cataract extraction with intraocular lens implant phacoemulsification; Right Eye  Surgeon: Baruch Goldmann MD Surgery Date:  12-07-21 Pre-Op Date:  11-30-21  HPI: A 28 Yr. old female patient 1. The patient is returning after cataract post-op. The left eye is affected. Status post cataract post-op, which began 1 week ago: Since the last visit, the affected area is doing well. The patient's vision is stable. Patient is following medication instructions. Pt reports seeing collors yellowish in left eye. The patient complains of sunlight glare causing poor vision, reading fine print, seeing captions or words on the TV, which began for an unknown amount of time in right eye The symptoms are constant. The condition is worse with daily activities. This is negatively affecting the patient's quality of life and the patient is unable to function adequately in life with the current level of vision. HPI was performed by Baruch Goldmann .  Medical History: Cataracts Asteroid Hyalosis OU High Blood Pressure  Review of Systems Negative Allergic/Immunologic Negative Cardiovascular Negative Constitutional Negative Ear, Nose, Mouth & Throat Negative Endocrine Negative Eyes Negative Gastrointestinal Negative Genitourinary Negative Hemotologic/Lymphatic Negative Integumentary Negative Musculoskeletal Negative Neurological Negative Psychiatry Negative Respiratory  Social   Never smoked   Medication Prednisolone-Moxifloxacin-Bromfenac,  Amlodipine, Tylenol,   Sx/Procedures Phaco c IOL OS,  Wrist Sx,   Drug Allergies  TITANUM DIOXIDE,   History & Physical: Heent: Cataract, right eye NECK: supple without bruits LUNGS: lungs clear to auscultation CV: regular rate and rhythm Abdomen: soft and non-tender Impression & Plan: Assessment: 1.  CATARACT EXTRACTION STATUS; Left Eye (Z98.42) 2.  NUCLEAR SCLEROSIS AGE  RELATED; , Right Eye (H25.11) 3.  Myopia ; Right Eye (H52.11)  Plan: 1.  1 week after cataract surgery. Doing well with improved vision and normal eye pressure. Call with any problems or concerns. Continue Pred-Moxi-Brom 2x/day for 3 more weeks.  2.  Cataract accounts for the patient's decreased vision. This visual impairment is not correctable with a tolerable change in glasses or contact lenses. Cataract surgery with an implantation of a new lens should significantly improve the visual and functional status of the patient. Discussed all risks, benefits, alternatives, and potential complications. Discussed the procedures and recovery. Patient desires to have surgery. A-scan ordered and performed today for intra-ocular lens calculations. The surgery will be performed in order to improve vision for driving, reading, and for eye examinations. Recommend phacoemulsification with intra-ocular lens. Recommend Dextenza for post-operative pain and inflammation. Right Eye. Surgery required to correct imbalance of vision. Dilates well - shugarcaine by protocol.  3.

## 2021-12-07 ENCOUNTER — Ambulatory Visit (HOSPITAL_COMMUNITY): Payer: Medicare HMO | Admitting: Anesthesiology

## 2021-12-07 ENCOUNTER — Ambulatory Visit (HOSPITAL_COMMUNITY)
Admission: RE | Admit: 2021-12-07 | Discharge: 2021-12-07 | Disposition: A | Discharge status: Home / Self Care | Payer: Medicare HMO | Attending: Ophthalmology | Admitting: Ophthalmology

## 2021-12-07 ENCOUNTER — Encounter (HOSPITAL_COMMUNITY)
Admission: RE | Disposition: A | Discharge status: Home / Self Care | Payer: Self-pay | Source: Home / Self Care | Attending: Ophthalmology

## 2021-12-07 ENCOUNTER — Ambulatory Visit (HOSPITAL_BASED_OUTPATIENT_CLINIC_OR_DEPARTMENT_OTHER): Payer: Medicare HMO | Admitting: Anesthesiology

## 2021-12-07 ENCOUNTER — Encounter (HOSPITAL_COMMUNITY): Payer: Self-pay | Admitting: Ophthalmology

## 2021-12-07 DIAGNOSIS — E119 Type 2 diabetes mellitus without complications: Secondary | ICD-10-CM | POA: Diagnosis not present

## 2021-12-07 DIAGNOSIS — Z9842 Cataract extraction status, left eye: Secondary | ICD-10-CM | POA: Insufficient documentation

## 2021-12-07 DIAGNOSIS — H2511 Age-related nuclear cataract, right eye: Secondary | ICD-10-CM

## 2021-12-07 DIAGNOSIS — I10 Essential (primary) hypertension: Secondary | ICD-10-CM | POA: Diagnosis not present

## 2021-12-07 DIAGNOSIS — J449 Chronic obstructive pulmonary disease, unspecified: Secondary | ICD-10-CM

## 2021-12-07 DIAGNOSIS — H5211 Myopia, right eye: Secondary | ICD-10-CM | POA: Diagnosis not present

## 2021-12-07 DIAGNOSIS — Z79899 Other long term (current) drug therapy: Secondary | ICD-10-CM | POA: Insufficient documentation

## 2021-12-07 HISTORY — PX: CATARACT EXTRACTION W/PHACO: SHX586

## 2021-12-07 SURGERY — PHACOEMULSIFICATION, CATARACT, WITH IOL INSERTION
Anesthesia: Monitor Anesthesia Care | Site: Eye | Laterality: Right

## 2021-12-07 MED ORDER — LIDOCAINE HCL 3.5 % OP GEL
1.0000 | Freq: Once | OPHTHALMIC | Status: AC
  Administered 2021-12-07: 1 via OPHTHALMIC

## 2021-12-07 MED ORDER — SODIUM HYALURONATE 23MG/ML IO SOSY
PREFILLED_SYRINGE | INTRAOCULAR | Status: DC | PRN
  Administered 2021-12-07: 0.6 mL via INTRAOCULAR

## 2021-12-07 MED ORDER — BSS IO SOLN
INTRAOCULAR | Status: DC | PRN
  Administered 2021-12-07: 15 mL via INTRAOCULAR

## 2021-12-07 MED ORDER — FENTANYL CITRATE (PF) 100 MCG/2ML IJ SOLN
INTRAMUSCULAR | Status: DC | PRN
  Administered 2021-12-07: 100 ug via INTRAVENOUS

## 2021-12-07 MED ORDER — FENTANYL CITRATE (PF) 100 MCG/2ML IJ SOLN
INTRAMUSCULAR | Status: AC
  Filled 2021-12-07: qty 2

## 2021-12-07 MED ORDER — POVIDONE-IODINE 5 % OP SOLN
OPHTHALMIC | Status: DC | PRN
  Administered 2021-12-07: 1 via OPHTHALMIC

## 2021-12-07 MED ORDER — EPINEPHRINE PF 1 MG/ML IJ SOLN
INTRAMUSCULAR | Status: AC
  Filled 2021-12-07: qty 1

## 2021-12-07 MED ORDER — PHENYLEPHRINE-KETOROLAC 1-0.3 % IO SOLN
INTRAOCULAR | Status: DC | PRN
  Administered 2021-12-07: 500 mL via OPHTHALMIC

## 2021-12-07 MED ORDER — LIDOCAINE HCL (PF) 1 % IJ SOLN
INTRAOCULAR | Status: DC | PRN
  Administered 2021-12-07: 1 mL via OPHTHALMIC

## 2021-12-07 MED ORDER — STERILE WATER FOR IRRIGATION IR SOLN
Status: DC | PRN
  Administered 2021-12-07: 50 mL

## 2021-12-07 MED ORDER — MOXIFLOXACIN HCL 0.5 % OP SOLN
OPHTHALMIC | Status: DC | PRN
  Administered 2021-12-07: 2 [drp] via OPHTHALMIC

## 2021-12-07 MED ORDER — PHENYLEPHRINE HCL 2.5 % OP SOLN
1.0000 [drp] | OPHTHALMIC | Status: AC | PRN
  Administered 2021-12-07 (×3): 1 [drp] via OPHTHALMIC

## 2021-12-07 MED ORDER — PHENYLEPHRINE-KETOROLAC 1-0.3 % IO SOLN
INTRAOCULAR | Status: AC
  Filled 2021-12-07: qty 4

## 2021-12-07 MED ORDER — TROPICAMIDE 1 % OP SOLN
1.0000 [drp] | OPHTHALMIC | Status: AC | PRN
  Administered 2021-12-07 (×3): 1 [drp] via OPHTHALMIC

## 2021-12-07 MED ORDER — SODIUM HYALURONATE 10 MG/ML IO SOLUTION
PREFILLED_SYRINGE | INTRAOCULAR | Status: DC | PRN
  Administered 2021-12-07: 0.85 mL via INTRAOCULAR

## 2021-12-07 MED ORDER — TETRACAINE HCL 0.5 % OP SOLN
1.0000 [drp] | OPHTHALMIC | Status: AC | PRN
  Administered 2021-12-07 (×3): 1 [drp] via OPHTHALMIC

## 2021-12-07 SURGICAL SUPPLY — 16 items
CATARACT SUITE SIGHTPATH (MISCELLANEOUS) ×1 IMPLANT
CLOTH BEACON ORANGE TIMEOUT ST (SAFETY) ×1 IMPLANT
EYE SHIELD UNIVERSAL CLEAR (GAUZE/BANDAGES/DRESSINGS) IMPLANT
FEE CATARACT SUITE SIGHTPATH (MISCELLANEOUS) ×1 IMPLANT
GLOVE BIOGEL PI IND STRL 7.0 (GLOVE) ×2 IMPLANT
GLOVE SS BIOGEL STRL SZ 6.5 (GLOVE) IMPLANT
GOWN SRG XL 47XLVL 4 REINF (GOWN DISPOSABLE) IMPLANT
GOWN STRL NON-REIN XL LVL4 (GOWN DISPOSABLE) ×1
LENS IOL RAYNER 22.0 (Intraocular Lens) ×1 IMPLANT
LENS IOL RAYONE EMV 22.0 (Intraocular Lens) IMPLANT
NDL HYPO 18GX1.5 BLUNT FILL (NEEDLE) ×1 IMPLANT
NEEDLE HYPO 18GX1.5 BLUNT FILL (NEEDLE) ×1 IMPLANT
PAD ARMBOARD 7.5X6 YLW CONV (MISCELLANEOUS) ×1 IMPLANT
SYR TB 1ML LL NO SAFETY (SYRINGE) ×1 IMPLANT
TAPE SURG TRANSPORE 1 IN (GAUZE/BANDAGES/DRESSINGS) IMPLANT
TAPE SURGICAL TRANSPORE 1 IN (GAUZE/BANDAGES/DRESSINGS) ×1

## 2021-12-07 NOTE — Op Note (Signed)
Date of procedure: 12/07/21  Pre-operative diagnosis: Visually significant age-related nuclear cataract, Right Eye (H25.11)  Post-operative diagnosis: Visually significant age-related nuclear cataract, Right Eye  Procedure: Removal of cataract via phacoemulsification and insertion of intra-ocular lens Rayner [id number] +22.0D into the capsular bag of the Right Eye  Attending surgeon: Gerda Diss. Remmie Bembenek, MD, MA  Anesthesia: MAC, Topical Akten  Complications: None  Estimated Blood Loss: <74m (minimal)  Specimens: None  Implants: As above  Indications:  Visually significant age-related cataract, Right Eye  Procedure:  The patient was seen and identified in the pre-operative area. The operative eye was identified and dilated.  The operative eye was marked.  Topical anesthesia was administered to the operative eye.     The patient was then to the operative suite and placed in the supine position.  A timeout was performed confirming the patient, procedure to be performed, and all other relevant information.   The patient's face was prepped and draped in the usual fashion for intra-ocular surgery.  A lid speculum was placed into the operative eye and the surgical microscope moved into place and focused.  A superotemporal paracentesis was created using a 20 gauge paracentesis blade.  Shugarcaine was injected into the anterior chamber.  Viscoelastic was injected into the anterior chamber.  A temporal clear-corneal main wound incision was created using a 2.454mmicrokeratome.  A continuous curvilinear capsulorrhexis was initiated using an irrigating cystitome and completed using capsulorrhexis forceps.  Hydrodissection and hydrodeliniation were performed.  Viscoelastic was injected into the anterior chamber.  A phacoemulsification handpiece and a chopper as a second instrument were used to remove the nucleus and epinucleus. The irrigation/aspiration handpiece was used to remove any remaining cortical  material.   The capsular bag was reinflated with viscoelastic, checked, and found to be intact.  The intraocular lens was inserted into the capsular bag.  The irrigation/aspiration handpiece was used to remove any remaining viscoelastic.  The clear corneal wound and paracentesis wounds were then hydrated and checked with Weck-Cels to be watertight.  The lid-speculum and drape was removed, and the patient's face was cleaned with a wet and dry 4x4.  Moxifloxacin drops were instilled on the eye. A clear shield was taped over the eye. The patient was taken to the post-operative care unit in good condition, having tolerated the procedure well.  Post-Op Instructions: [facility] and will receive all other orders and instructions.

## 2021-12-07 NOTE — Interval H&P Note (Signed)
History and Physical Interval Note:  12/07/2021 9:45 AM  Amanda Juarez  has presented today for surgery, with the diagnosis of nuclear sclerosis age related cataract; right.  The various methods of treatment have been discussed with the patient and family. After consideration of risks, benefits and other options for treatment, the patient has consented to  Procedure(s) with comments: CATARACT EXTRACTION PHACO AND INTRAOCULAR LENS PLACEMENT (IOC) (Right) - CDE:  as a surgical intervention.  The patient's history has been reviewed, patient examined, no change in status, stable for surgery.  I have reviewed the patient's chart and labs.  Questions were answered to the patient's satisfaction.     Baruch Goldmann

## 2021-12-07 NOTE — Discharge Instructions (Addendum)
Please discharge patient when stable, will follow up today with Dr. Wrzosek at the [facility] office immediately following discharge.  Leave shield in place until visit.  All paperwork with discharge instructions will be given at the office.  [facility] Address:  [address]  [place], [place] [address]   PATIENT INSTRUCTIONS POST-ANESTHESIA  IMMEDIATELY FOLLOWING SURGERY:  Do not drive or operate machinery for the first twenty four hours after surgery.  Do not make any important decisions for twenty four hours after surgery or while taking narcotic pain medications or sedatives.  If you develop intractable nausea and vomiting or a severe headache please notify your doctor immediately.  FOLLOW-UP:  Please make an appointment with your surgeon as instructed. You do not need to follow up with anesthesia unless specifically instructed to do so.  WOUND CARE INSTRUCTIONS (if applicable):  Keep a dry clean dressing on the anesthesia/puncture wound site if there is drainage.  Once the wound has quit draining you may leave it open to air.  Generally you should leave the bandage intact for twenty four hours unless there is drainage.  If the epidural site drains for more than 36-48 hours please call the anesthesia department.  QUESTIONS?:  Please feel free to call your physician or the hospital operator if you have any questions, and they will be happy to assist you.       

## 2021-12-07 NOTE — Anesthesia Postprocedure Evaluation (Signed)
Anesthesia Post Note  Patient: Amanda Juarez  Procedure(s) Performed: CATARACT EXTRACTION PHACO AND INTRAOCULAR LENS PLACEMENT (IOC) (Right: Eye)  Patient location during evaluation: Phase II Anesthesia Type: MAC Level of consciousness: awake and alert and oriented Pain management: pain level controlled Vital Signs Assessment: post-procedure vital signs reviewed and stable Respiratory status: spontaneous breathing, nonlabored ventilation and respiratory function stable Cardiovascular status: stable and blood pressure returned to baseline Postop Assessment: no apparent nausea or vomiting Anesthetic complications: no   There were no known notable events for this encounter.   Last Vitals:  Vitals:   12/07/21 0901 12/07/21 1009  BP: (!) 164/82 (!) 158/94  Pulse: 77 84  Resp: 16 11  Temp: 36.7 C 37.1 C  SpO2: 98% 99%    Last Pain:  Vitals:   12/07/21 1009  TempSrc: Oral  PainSc: 0-No pain                 Altair Stanko C Shalva Rozycki

## 2021-12-07 NOTE — Anesthesia Preprocedure Evaluation (Signed)
Anesthesia Evaluation  Patient identified by MRN, date of birth, ID band Patient awake    Reviewed: Allergy & Precautions, NPO status , Patient's Chart, lab work & pertinent test results  Airway Mallampati: II  TM Distance: >3 FB Neck ROM: Full    Dental  (+) Dental Advisory Given, Upper Dentures   Pulmonary COPD,    Pulmonary exam normal breath sounds clear to auscultation       Cardiovascular Exercise Tolerance: Poor hypertension, Pt. on medications Normal cardiovascular exam Rhythm:Regular Rate:Normal     Neuro/Psych PSYCHIATRIC DISORDERS Anxiety Dementia negative neurological ROS     GI/Hepatic negative GI ROS, Neg liver ROS,   Endo/Other  diabetes, Well Controlled  Renal/GU negative Renal ROS  negative genitourinary   Musculoskeletal negative musculoskeletal ROS (+)   Abdominal   Peds negative pediatric ROS (+)  Hematology negative hematology ROS (+)   Anesthesia Other Findings   Reproductive/Obstetrics negative OB ROS                             Anesthesia Physical Anesthesia Plan  ASA: 3  Anesthesia Plan: MAC   Post-op Pain Management: Minimal or no pain anticipated   Induction:   PONV Risk Score and Plan: 1  Airway Management Planned: Nasal Cannula and Natural Airway  Additional Equipment:   Intra-op Plan:   Post-operative Plan:   Informed Consent: I have reviewed the patients History and Physical, chart, labs and discussed the procedure including the risks, benefits and alternatives for the proposed anesthesia with the patient or authorized representative who has indicated his/her understanding and acceptance.     Dental advisory given  Plan Discussed with: CRNA and Surgeon  Anesthesia Plan Comments:         Anesthesia Quick Evaluation

## 2021-12-07 NOTE — Transfer of Care (Signed)
Immediate Anesthesia Transfer of Care Note  Patient: Amanda Juarez  Procedure(s) Performed: CATARACT EXTRACTION PHACO AND INTRAOCULAR LENS PLACEMENT (IOC) (Right: Eye)  Patient Location: PACU and Short Stay  Anesthesia Type:MAC  Level of Consciousness: awake  Airway & Oxygen Therapy: Patient Spontanous Breathing  Post-op Assessment: Report given to RN  Post vital signs: Reviewed  Last Vitals:  Vitals Value Taken Time  BP    Temp    Pulse    Resp    SpO2      Last Pain:  Vitals:   12/07/21 1009  TempSrc: Oral  PainSc: 0-No pain         Complications: There were no known notable events for this encounter.

## 2021-12-09 ENCOUNTER — Encounter (HOSPITAL_COMMUNITY): Payer: Self-pay | Admitting: Ophthalmology

## 2023-01-28 ENCOUNTER — Encounter (HOSPITAL_COMMUNITY): Payer: Self-pay | Admitting: Emergency Medicine

## 2023-01-28 ENCOUNTER — Other Ambulatory Visit: Payer: Self-pay

## 2023-01-28 ENCOUNTER — Emergency Department (HOSPITAL_COMMUNITY)
Admission: EM | Admit: 2023-01-28 | Discharge: 2023-01-28 | Disposition: A | Discharge status: Home / Self Care | Payer: Medicare HMO | Attending: Emergency Medicine | Admitting: Emergency Medicine

## 2023-01-28 ENCOUNTER — Emergency Department (HOSPITAL_COMMUNITY): Payer: Medicare HMO

## 2023-01-28 DIAGNOSIS — H7191 Unspecified cholesteatoma, right ear: Secondary | ICD-10-CM | POA: Diagnosis present

## 2023-01-28 DIAGNOSIS — J449 Chronic obstructive pulmonary disease, unspecified: Secondary | ICD-10-CM | POA: Insufficient documentation

## 2023-01-28 DIAGNOSIS — R410 Disorientation, unspecified: Secondary | ICD-10-CM | POA: Insufficient documentation

## 2023-01-28 DIAGNOSIS — F0390 Unspecified dementia without behavioral disturbance: Secondary | ICD-10-CM | POA: Diagnosis not present

## 2023-01-28 DIAGNOSIS — N3000 Acute cystitis without hematuria: Secondary | ICD-10-CM | POA: Diagnosis not present

## 2023-01-28 DIAGNOSIS — W19XXXA Unspecified fall, initial encounter: Secondary | ICD-10-CM | POA: Diagnosis not present

## 2023-01-28 LAB — CBC WITH DIFFERENTIAL/PLATELET
Abs Immature Granulocytes: 0.02 10*3/uL (ref 0.00–0.07)
Basophils Absolute: 0.1 10*3/uL (ref 0.0–0.1)
Basophils Relative: 1 %
Eosinophils Absolute: 0.1 10*3/uL (ref 0.0–0.5)
Eosinophils Relative: 1 %
HCT: 39.4 % (ref 36.0–46.0)
Hemoglobin: 13.7 g/dL (ref 12.0–15.0)
Immature Granulocytes: 0 %
Lymphocytes Relative: 24 %
Lymphs Abs: 1.5 10*3/uL (ref 0.7–4.0)
MCH: 30.2 pg (ref 26.0–34.0)
MCHC: 34.8 g/dL (ref 30.0–36.0)
MCV: 86.8 fL (ref 80.0–100.0)
Monocytes Absolute: 0.6 10*3/uL (ref 0.1–1.0)
Monocytes Relative: 9 %
Neutro Abs: 4.3 10*3/uL (ref 1.7–7.7)
Neutrophils Relative %: 65 %
Platelets: 305 10*3/uL (ref 150–400)
RBC: 4.54 MIL/uL (ref 3.87–5.11)
RDW: 14.2 % (ref 11.5–15.5)
WBC: 6.5 10*3/uL (ref 4.0–10.5)
nRBC: 0 % (ref 0.0–0.2)

## 2023-01-28 LAB — COMPREHENSIVE METABOLIC PANEL
ALT: 14 U/L (ref 0–44)
AST: 23 U/L (ref 15–41)
Albumin: 3.4 g/dL — ABNORMAL LOW (ref 3.5–5.0)
Alkaline Phosphatase: 74 U/L (ref 38–126)
Anion gap: 12 (ref 5–15)
BUN: 16 mg/dL (ref 8–23)
CO2: 23 mmol/L (ref 22–32)
Calcium: 8.8 mg/dL — ABNORMAL LOW (ref 8.9–10.3)
Chloride: 99 mmol/L (ref 98–111)
Creatinine, Ser: 0.62 mg/dL (ref 0.44–1.00)
GFR, Estimated: >60 mL/min (ref >60)
Glucose, Bld: 116 mg/dL — ABNORMAL HIGH (ref 70–99)
Potassium: 3.7 mmol/L (ref 3.5–5.1)
Sodium: 134 mmol/L — ABNORMAL LOW (ref 135–145)
Total Bilirubin: 1.1 mg/dL (ref <1.2)
Total Protein: 7.1 g/dL (ref 6.5–8.1)

## 2023-01-28 LAB — URINALYSIS, ROUTINE W REFLEX MICROSCOPIC
Bilirubin Urine: NEGATIVE
Glucose, UA: NEGATIVE mg/dL
Ketones, ur: 5 mg/dL — AB
Nitrite: POSITIVE — AB
Protein, ur: NEGATIVE mg/dL
Specific Gravity, Urine: 1.01 (ref 1.005–1.030)
pH: 6 (ref 5.0–8.0)

## 2023-01-28 LAB — LIPASE, BLOOD: Lipase: 32 U/L (ref 11–51)

## 2023-01-28 MED ORDER — CEPHALEXIN 500 MG PO CAPS: 500.0000 mg | ORAL_CAPSULE | Freq: Four times a day (QID) | ORAL | 0 refills | Status: AC

## 2023-01-28 NOTE — ED Triage Notes (Signed)
Pt lives with family who called ems due to 8 falls in past few days, with dementia at baseline. Pt alert/oriented to self. C collar in place by ems. Palpated all bony prominences with no ss of pain noted by pt. No obvious markings/deformities noted.

## 2023-01-28 NOTE — ED Provider Notes (Addendum)
[place] EMERGENCY DEPARTMENT AT [facility] Provider Note   CSN: [id number] Arrival date & time: 01/28/23  1124     History  Chief Complaint  Patient presents with   Amanda Juarez is a 82 y.o. female.  Patient brought in EMS.  Family states that she has had 8 falls in the past week.  She is got dementia and she is baseline with that.  EMS put her in a c-collar.  They found no bony abnormalities.  Patient alert but confused baseline for her dementia upon arrival.  Oxygen sats were 94% temp 97.7 heart rate 89 blood pressure 151/78 respirations 18.  Patient in no acute distress.  Patient is not on blood thinners.  Past medical history significant for the dementia hypertension compression fracture of C1 vertebrae and COPD.  Patient's also had a pin placed in her hip.  Patient is never used tobacco products.  Family states that she kind of drags her right lower extremity.  She has been doing that now for a a while.  At least a week.       Home Medications Prior to Admission medications   Medication Sig Start Date End Date Taking? Authorizing Provider  acetaminophen (TYLENOL 8 HOUR) 650 MG CR tablet Take 1 tablet (650 mg total) by mouth every 8 (eight) hours as needed for pain. 09/09/21   Leath-Warren, Sadie Haber, NP  amLODipine (NORVASC) 5 MG tablet Take 5 mg by mouth daily. 11/25/22   [provider]  diclofenac Sodium (VOLTAREN ARTHRITIS PAIN) 1 % GEL Apply 4 g topically 4 (four) times daily. 09/09/21   Leath-Warren, Sadie Haber, NP  lidocaine (LIDODERM) 5 % Place 1 patch onto the skin daily. Remove & Discard patch within 12 hours or as directed by MD 09/09/21   Leath-Warren, Sadie Haber, NP      Allergies    Titanium dioxide    Review of Systems   Review of Systems  Unable to perform ROS: Dementia    Physical Exam Updated Vital Signs BP (!) 151/78   Pulse 100   Temp 97.7 F (36.5 C) (Oral)   Resp 18   SpO2 94%  Physical Exam Vitals and nursing  note reviewed.  Constitutional:      General: She is not in acute distress.    Appearance: Normal appearance. She is well-developed.  HENT:     Head: Normocephalic and atraumatic.     Mouth/Throat:     Mouth: Mucous membranes are moist.  Eyes:     Extraocular Movements: Extraocular movements intact.     Conjunctiva/sclera: Conjunctivae normal.     Pupils: Pupils are equal, round, and reactive to light.  Cardiovascular:     Rate and Rhythm: Normal rate and regular rhythm.     Heart sounds: No murmur heard. Pulmonary:     Effort: Pulmonary effort is normal. No respiratory distress.     Breath sounds: Normal breath sounds.  Abdominal:     Palpations: Abdomen is soft.     Tenderness: There is no abdominal tenderness.  Musculoskeletal:        General: No swelling, tenderness, deformity or signs of injury.     Cervical back: Neck supple.  Skin:    General: Skin is warm and dry.     Capillary Refill: Capillary refill takes less than 2 seconds.  Neurological:     Mental Status: She is alert. Mental status is at baseline.  Psychiatric:  Mood and Affect: Mood normal.     ED Results / Procedures / Treatments   Labs (all labs ordered are listed, but only abnormal results are displayed) Labs Reviewed  COMPREHENSIVE METABOLIC PANEL - Abnormal; Notable for the following components:      Result Value   Sodium 134 (*)    Glucose, Bld 116 (*)    Calcium 8.8 (*)    Albumin 3.4 (*)    All other components within normal limits  LIPASE, BLOOD  CBC WITH DIFFERENTIAL/PLATELET  URINALYSIS, ROUTINE W REFLEX MICROSCOPIC    EKG None  Radiology DG Hips Bilat W or Wo Pelvis 3-4 Views  Result Date: 01/28/2023 CLINICAL DATA:  Multiple falls over the last few days.  Dementia. EXAM: DG HIP (WITH OR WITHOUT PELVIS) 3-4V BILAT COMPARISON:  Pelvic and right hip radiographs 07/02/2022 FINDINGS: The bones are demineralized. There are stable postsurgical changes from intramedullary rod  fixation of the proximal right femur. The hardware is intact without evidence of loosening. No evidence of acute fracture or dislocation. Old right inferior pubic ramus fracture and mild degenerative changes of both hips are unchanged. Diffuse aortic and iliofemoral atherosclerosis. IMPRESSION: 1. No evidence of acute fracture or dislocation. Stable postsurgical changes from intramedullary rod fixation of the proximal right femur. 2. Old right inferior pubic ramus fracture. Electronically Signed   By: Carey Bullocks M.D.   On: 01/28/2023 14:34   DG Chest Port 1 View  Result Date: 01/28/2023 CLINICAL DATA:  Fall.  Altered mental status. EXAM: PORTABLE CHEST 1 VIEW COMPARISON:  Radiographs 07/01/2022 and 09/09/2021. FINDINGS: 1405 hours. Examination mildly limited by overlying bra straps. The heart size and mediastinal contours are stable. Known hiatal hernia is not well visualized. The lung bases are hypoventilated without evidence of airspace disease, edema, pleural effusion or pneumothorax. No evidence of acute fracture. IMPRESSION: Similar appearance of the chest. No evidence of acute cardiopulmonary process. Electronically Signed   By: Carey Bullocks M.D.   On: 01/28/2023 14:31   CT Head Wo Contrast  Result Date: 01/28/2023 CLINICAL DATA:  Head trauma, minor.  Altered mental status. EXAM: CT HEAD WITHOUT CONTRAST TECHNIQUE: Contiguous axial images were obtained from the base of the skull through the vertex without intravenous contrast. RADIATION DOSE REDUCTION: This exam was performed according to the departmental dose-optimization program which includes automated exposure control, adjustment of the mA and/or kV according to patient size and/or use of iterative reconstruction technique. COMPARISON:  CT head without contrast 07/01/2022 FINDINGS: Brain: No acute infarct, hemorrhage, or mass lesion is present. Moderate atrophy and white matter disease is stable. Deep brain nuclei are within normal  limits. The ventricles are proportionate to the degree of atrophy. No significant extraaxial fluid collection is present. The brainstem and cerebellum are within normal limits. Midline structures are within normal limits. Vascular: Atherosclerotic calcifications are present within the cavernous internal carotid arteries bilaterally and at the dural margin of both vertebral arteries. Skull: Calvarium is intact. No focal lytic or blastic lesions are present. No significant extracranial soft tissue lesion is present. Sinuses/Orbits: A right mastoid effusion is present. A soft tissue lesion in Prussak's space extends into the epitympanum, concerning for acquired cholesteatoma. Left mastoid air cells and middle ear clear. Paranasal sinuses are clear. Bilateral lens replacements are noted. Globes and orbits are otherwise unremarkable. IMPRESSION: 1. No acute intracranial abnormality or significant interval change. 2. Stable atrophy and white matter disease. This likely reflects the sequela of chronic microvascular ischemia. 3. Right mastoid effusion with  soft tissue lesion in Prussak's space extending into the epitympanum, concerning for acquired cholesteatoma. These results were called by telephone at the time of interpretation on 01/28/2023 at 2:15 pm to provider Vanetta Mulders , who verbally acknowledged these results. Electronically Signed   By: Marin Roberts M.D.   On: 01/28/2023 14:15   CT Cervical Spine Wo Contrast  Result Date: 01/28/2023 CLINICAL DATA:  Neck trauma. Fall with head injury. Multiple recent falls. EXAM: CT CERVICAL SPINE WITHOUT CONTRAST TECHNIQUE: Multidetector CT imaging of the cervical spine was performed without intravenous contrast. Multiplanar CT image reconstructions were also generated. RADIATION DOSE REDUCTION: This exam was performed according to the departmental dose-optimization program which includes automated exposure control, adjustment of the mA and/or kV according to  patient size and/or use of iterative reconstruction technique. COMPARISON:  CT cervical spine 07/01/2022. FINDINGS: Alignment: Stable minimal anterolisthesis at C7-T1. Exaggerated cervical lordosis and thoracic kyphosis. Skull base and vertebrae: No evidence of acute cervical spine fracture or traumatic subluxation. Soft tissues and spinal canal: No prevertebral fluid or swelling. No visible canal hematoma. Disc levels: Stable mild multilevel spondylosis with disc space narrowing and endplate osteophytes greatest at C5-6 and C6-7. No large disc herniation or high-grade foraminal narrowing identified. Upper chest: Clear lung apices with stable mild subpleural reticulation bilaterally. Other: Bilateral carotid atherosclerosis. Advanced degenerative changes at the temporomandibular joints bilaterally. Right mastoid effusion. Cranial details dictated separately. IMPRESSION: 1. No evidence of acute cervical spine fracture, traumatic subluxation or static signs of instability. 2. Stable mild multilevel cervical spondylosis. 3. Advanced degenerative changes at the temporomandibular joints bilaterally. Electronically Signed   By: Carey Bullocks M.D.   On: 01/28/2023 14:13    Procedures Procedures    Medications Ordered in ED Medications - No data to display  ED Course/ Medical Decision Making/ A&P                                 Medical Decision Making Amount and/or Complexity of Data Reviewed Labs: ordered. Radiology: ordered.  Risk Prescription drug management.   Workup here today without any acute evidence of any injuries.  Also labs were done.  Patient's lipase was 32 sodium 134 renal function GFR greater than 60 LFTs were normal.  Electrolytes normal.  CBC no leukocytosis hemoglobin 13.7 platelets 305.  Urinalysis still pending.  Patient's chest x-ray no acute findings.  X-rays of both hips no evidence of acute fracture or dislocation stable postsurgical changes from intramedullary rod fixation  of the proximal right femur.  Old right inferior pubic rami fracture.  CT head no acute intracranial abnormalities.  There is a right mastoid effusion with soft tissue lesion in the presacral space extending into the epitympanic concerning for acquired cholesteatoma.  This was discussed with the family and will need follow-up with ear nose and throat.  Referral to Dr. Corliss Marcus provided.  CT cervical spine no acute cervical fractures.  Family now informs me they do not want a wait for the urinalysis so we will go ahead and discharge with follow-up with ear nose and throat they are going to use MyChart to follow-up on the urine.  We apologize for the delays in the urine.  But apparently the machine broke.  Urinalysis just came back as we were processing their discharge.  Consistent with urinary tract infection.  Nitrite positive many bacteria 21-50 white blood cells.  Have sent for culture.  Will treat her with Keflex.  Have her follow-up with her doctor to make sure that it clears.   Final Clinical Impression(s) / ED Diagnoses Final diagnoses:  Fall, initial encounter  Cholesteatoma of right ear    Rx / DC Orders ED Discharge Orders     None         Vanetta Mulders, MD 01/28/23 1741    Vanetta Mulders, MD 01/28/23 [phone]

## 2023-01-28 NOTE — Discharge Instructions (Addendum)
CT found an incidental cholesteatoma in the right ear area.  You need to follow-up with ear nose and throat.  Information provided above call on Monday to set up an appointment.  Otherwise workup was negative.  Follow-up on the urinalysis on MyChart.  Also recommend he follow back up with primary care doctor.  Urinalysis just came back.  Is consistent with urinary tract infection.  Take the antibiotic as directed.  Would expect improvement in the next few days.  Follow-up with her doctor to make sure it improves.  Have sent urine for culture as well.

## 2023-01-31 LAB — URINE CULTURE: Culture: >=100000 — AB

## 2023-02-01 ENCOUNTER — Telehealth (HOSPITAL_BASED_OUTPATIENT_CLINIC_OR_DEPARTMENT_OTHER): Payer: Self-pay | Admitting: *Deleted

## 2023-02-01 NOTE — Telephone Encounter (Signed)
Post ED Visit - Positive Culture Follow-up  Culture report reviewed by antimicrobial stewardship pharmacist: [facility] [x] Ivery Quale, [place].D. []  Celedonio Miyamoto, Pharm.D., BCPS AQ-ID []  Garvin Fila, Pharm.D., BCPS []  Georgina Pillion, Pharm.D., BCPS []  [place], [address].D., BCPS, AAHIVP []  Estella Husk, Pharm.D., BCPS, AAHIVP []  Lysle Pearl, PharmD, BCPS []  Phillips Climes, PharmD, BCPS []  Agapito Games, PharmD, BCPS []  Verlan Friends, PharmD []  Mervyn Gay, PharmD, BCPS []  Vinnie Level, PharmD  [facility] []  Len Childs, PharmD []  Greer Pickerel, PharmD []  Adalberto Cole, PharmD []  Perlie Gold, Rph []  Lonell Face) Jean Rosenthal, PharmD []  Earl Many, PharmD []  Junita Push, PharmD []  Dorna Leitz, PharmD []  Terrilee Files, PharmD []  Lynann Beaver, PharmD []  Keturah Barre, PharmD []  Loralee Pacas, PharmD []  Bernadene Person, PharmD   Positive urine culture Treated with Cephalexin, organism sensitive to the same and no further patient follow-up is required at this time.  Virl Axe [facility] 02/01/2023, 8:35 AM

## 2023-05-31 DEATH — deceased
